# Patient Record
Sex: Female | Born: 1935 | Race: White | Hispanic: No | Smoking: Never smoker
Health system: Southern US, Community
[De-identification: ages and names within clinical notes are randomized; demographics above are authoritative.]

## PROBLEM LIST (undated history)

## (undated) DIAGNOSIS — C50919 Malignant neoplasm of unspecified site of unspecified female breast: Secondary | ICD-10-CM

## (undated) HISTORY — PX: HERNIA REPAIR: SHX51

## (undated) HISTORY — PX: BREAST LUMPECTOMY: SHX2

## (undated) HISTORY — PX: OTHER SURGICAL HISTORY: SHX169

## (undated) HISTORY — PX: CHOLECYSTECTOMY: SHX55

## (undated) HISTORY — DX: Malignant neoplasm of unspecified site of unspecified female breast: C50.919

---

## 2015-05-20 ENCOUNTER — Other Ambulatory Visit: Payer: Self-pay | Admitting: "Endocrinology

## 2015-05-20 DIAGNOSIS — E041 Nontoxic single thyroid nodule: Secondary | ICD-10-CM

## 2015-05-25 ENCOUNTER — Ambulatory Visit (HOSPITAL_COMMUNITY)
Admission: RE | Admit: 2015-05-25 | Discharge: 2015-05-25 | Disposition: A | Discharge status: Home / Self Care | Payer: Medicare FFS | Source: Ambulatory Visit | Attending: "Endocrinology | Admitting: "Endocrinology

## 2015-05-25 ENCOUNTER — Other Ambulatory Visit: Payer: Self-pay | Admitting: "Endocrinology

## 2015-05-25 DIAGNOSIS — E041 Nontoxic single thyroid nodule: Secondary | ICD-10-CM

## 2015-05-25 MED ORDER — LIDOCAINE HCL (PF) 2 % IJ SOLN
INTRAMUSCULAR | Status: AC
  Administered 2015-05-25: 10 mL
  Filled 2015-05-25: qty 10

## 2015-05-25 NOTE — Discharge Instructions (Signed)
Thyroid Biopsy °The thyroid gland is a butterfly-shaped gland situated in the front of the neck. It produces hormones which affect metabolism, growth and development, and body temperature. A thyroid biopsy is a procedure in which small samples of tissue or fluid are removed from the thyroid gland or mass and examined under a microscope. This test is done to determine the cause of thyroid problems, such as infection, cancer, or other thyroid problems. °There are 2 ways to obtain samples: °1. Fine needle biopsy. Samples are removed using a thin needle inserted through the skin and into the thyroid gland or mass. °2. Open biopsy. Samples are removed after a cut (incision) is made through the skin. °LET YOUR CAREGIVER KNOW ABOUT:  °· Allergies. °· Medications taken including herbs, eye drops, over-the-counter medications, and creams. °· Use of steroids (by mouth or creams). °· Previous problems with anesthetics or numbing medicine. °· Possibility of pregnancy, if this applies. °· History of blood clots (thrombophlebitis). °· History of bleeding or blood problems. °· Previous surgery. °· Other health problems. °RISKS AND COMPLICATIONS °· Bleeding from the site. The risk of bleeding is higher if you have a bleeding disorder or are taking any blood thinning medications (anticoagulants). °· Infection. °· Injury to structures near the thyroid gland. °BEFORE THE PROCEDURE  °This is a procedure that can be done as an outpatient. Confirm the time that you need to arrive for your procedure. Confirm whether there is a need to fast or withhold any medications. A blood sample may be done to determine your blood clotting time. Medicine may be given to help you relax (sedative). °PROCEDURE °Fine needle biopsy. °You will be awake during the procedure. You may be asked to lie on your back with your head tipped backward to extend your neck. Let your caregiver know if you cannot tolerate the positioning. An area on your neck will be  cleansed. A needle is inserted through the skin of your neck. You may feel a mild discomfort during this procedure. You may be asked to avoid coughing, talking, swallowing, or making sounds during some portions of the procedure. The needle is withdrawn once tissue or fluid samples have been removed. Pressure may be applied to the neck to reduce swelling and ensure that bleeding has stopped. The samples will be sent for examination.  °Open biopsy. °You will be given general anesthesia. You will be asleep during the procedure. An incision is made in your neck. A sample of thyroid tissue or the mass is removed. The tissue sample or mass will be sent for examination. The sample or mass may be examined during the biopsy. If the sample or mass contains cancer cells, some or all of the thyroid gland may be removed. The incision is closed with stitches. °AFTER THE PROCEDURE  °Your recovery will be assessed and monitored. If there are no problems, as an outpatient, you should be able to go home shortly after the procedure. °If you had a fine needle biopsy: °· You may have soreness at the biopsy site for 1 to 2 days. °If you had an open biopsy:  °· You may have soreness at the biopsy site for 3 to 4 days. °· You may have a hoarse voice or sore throat for 1 to 2 days. °Obtaining the Test Results °It is your responsibility to obtain your test results. Do not assume everything is normal if you have not heard from your caregiver or the medical facility. It is important for you to follow up   on all of your test results. °HOME CARE INSTRUCTIONS  °· Keeping your head raised on a pillow when you are lying down may ease biopsy site discomfort. °· Supporting the back of your head and neck with both hands as you sit up from a lying position may ease biopsy site discomfort. °· Only take over-the-counter or prescription medicines for pain, discomfort, or fever as directed by your caregiver. °· Throat lozenges or gargling with warm salt  water may help to soothe a sore throat. °SEEK IMMEDIATE MEDICAL CARE IF:  °· You have severe bleeding from the biopsy site. °· You have difficulty swallowing. °· You have a fever. °· You have increased pain, swelling, redness, or warmth at the biopsy site. °· You notice pus coming from the biopsy site. °· You have swollen glands (lymph nodes) in your neck. °Document Released: 06/19/2007 Document Revised: 12/17/2012 Document Reviewed: 11/14/2013 °Exit[employer]® Patient Information ©2015 [employer]. This information is not intended to replace advice given to you by your health care provider. Make sure you discuss any questions you have with your health care provider. ° °

## 2015-06-15 ENCOUNTER — Encounter: Payer: Self-pay | Admitting: "Endocrinology

## 2015-06-15 ENCOUNTER — Ambulatory Visit: Payer: Self-pay | Admitting: "Endocrinology

## 2015-06-15 ENCOUNTER — Ambulatory Visit (INDEPENDENT_AMBULATORY_CARE_PROVIDER_SITE_OTHER): Payer: Medicare FFS | Admitting: "Endocrinology

## 2015-06-15 VITALS — BP 124/80 | HR 77 | Ht 67.0 in | Wt 146.0 lb

## 2015-06-15 DIAGNOSIS — E042 Nontoxic multinodular goiter: Secondary | ICD-10-CM

## 2015-06-15 NOTE — Progress Notes (Signed)
Subjective:    Patient ID: Meghan Oconnell, female    DOB: [birth year],    Past Medical History  Diagnosis Date  . Breast cancer [facility])    Past Surgical History  Procedure Laterality Date  . Breast lumpectomy    . Cholecystectomy    . Other surgical history      Colon Surgery  . Hernia repair     Social History   Social History  . Marital Status: Widowed    Spouse Name: N/A  . Number of Children: N/A  . Years of Education: N/A   Social History Main Topics  . Smoking status: Never Smoker   . Smokeless tobacco: None  . Alcohol Use: No  . Drug Use: No  . Sexual Activity: Not Asked   Other Topics Concern  . None   Social History Narrative  . None   Outpatient Encounter Prescriptions as of 06/15/2015  Medication Sig  . citalopram (CELEXA) 40 MG tablet Take 40 mg by mouth daily.  . Linaclotide (LINZESS) 145 MCG CAPS capsule Take 145 mcg by mouth daily.   No facility-administered encounter medications on file as of 06/15/2015.   ALLERGIES: No Known Allergies VACCINATION STATUS:  There is no immunization history on file for this patient.  HPI 81 yr old female with medical hx as above. She is here to follow-up after FNA for MNG . Her FNA results are benign.  after initial thyroid incident adenoma on 09/22/2014 on CT scan after a blunt chest trauma , she underwent thyroid ultrasound on 2 occasions.  Her last repeat thyroid ultrasound on 04/30/2015 - showed  rt lobe 4.3 cms with 2 small nodules 22mm and 30mm, ( previously noted 45mm not seen on the repeat scan), left lobe 5.8 x 3.3x2.7 with 29mm new and 38 mm solid ovoid nodule (biopsied) . She denies choking, SOB. She however c/o dry cough which has been there for at least a year. she denies family hx of thyroid cancer, but one of her daughters has hx of MNG s/p thyroidectomy and on levothyroxine. Her TFTs in January were WNL.  Review of Systems   Constitutional: no weight gain/loss, no fatigue, no subjective  hyperthermia/hypothermia Eyes: no blurry vision, no xerophthalmia ENT: no sore throat, no nodules palpated in throat, no dysphagia/odynophagia, no hoarseness Cardiovascular: no CP/SOB/palpitations/leg swelling Respiratory: no cough/SOB Gastrointestinal: no N/V/D/C Musculoskeletal: no muscle/joint aches Skin: no rashes Neurological: no tremors/numbness/tingling/dizziness Psychiatric: no depression/anxiety   Objective:    BP 124/80 mmHg  Pulse 77  Ht 5\' 7"  (1.702 m)  Wt 146 lb (66.225 kg)  BMI 22.86 kg/m2  SpO2 95%  Wt Readings from Last 3 Encounters:  06/15/15 146 lb (66.225 kg)    Physical Exam Constitutional: overweight, in NAD Eyes: PERRLA, EOMI, no exophthalmos ENT: moist mucous membranes, has mild goiter, no cervical lymphadenopathy Cardiovascular: RRR, No MRG Respiratory: CTA B Gastrointestinal: abdomen soft, NT, ND, BS+ Musculoskeletal: no deformities, strength intact in all 4 Skin: moist, warm, no rashes Neurological: no tremor with outstretched hands, DTR normal in all 4    Assessment & Plan:   1. Multinodular goiter - Her THYROID, FINE NEEDLE ASPIRATION, LEFT (SPECIMEN 1 OF 1 COLLECTED 05/25/15): CONSISTENT WITH BENIGN FOLLICULAR NODULE (BETHESDA CATEGORY II). -She will not need intervention for this for now. -Given her TFTs WNL, she is euthyroid and hence no intervention is needed on thyroid function either. She will return in a year with thyroid function tests: - TSH - T4, free   I  advised patient to maintain close follow up with their PCP for primary care needs. Follow up plan: Return in about 1 year (around 06/14/2016) for follow up with pre-visit labs.  Glade Lloyd, MD Phone: [phone]  Fax: [phone]   06/15/2015, 3:31 PM

## 2016-06-15 ENCOUNTER — Ambulatory Visit (INDEPENDENT_AMBULATORY_CARE_PROVIDER_SITE_OTHER): Payer: Medicare FFS | Admitting: "Endocrinology

## 2016-06-15 ENCOUNTER — Encounter: Payer: Self-pay | Admitting: "Endocrinology

## 2016-06-15 VITALS — BP 125/77 | HR 73 | Ht 67.0 in | Wt 147.0 lb

## 2016-06-15 DIAGNOSIS — E042 Nontoxic multinodular goiter: Secondary | ICD-10-CM | POA: Diagnosis not present

## 2016-06-15 NOTE — Progress Notes (Signed)
Subjective:    Patient ID: Meghan Oconnell, female    DOB: 1931/01/22,    Past Medical History:  Diagnosis Date  . Breast cancer [facility])    Past Surgical History:  Procedure Laterality Date  . BREAST LUMPECTOMY    . CHOLECYSTECTOMY    . HERNIA REPAIR    . OTHER SURGICAL HISTORY     Colon Surgery   Social History   Social History  . Marital status: Widowed    Spouse name: N/A  . Number of children: N/A  . Years of education: N/A   Social History Main Topics  . Smoking status: Never Smoker  . Smokeless tobacco: Never Used  . Alcohol use No  . Drug use: No  . Sexual activity: Not Asked   Other Topics Concern  . None   Social History Narrative  . None   Outpatient Encounter Prescriptions as of 06/15/2016  Medication Sig  . ALPRAZolam (XANAX) 0.5 MG tablet Take 0.5 mg by mouth as needed for anxiety.  . citalopram (CELEXA) 40 MG tablet Take 40 mg by mouth daily.  . Linaclotide (LINZESS) 145 MCG CAPS capsule Take 145 mcg by mouth daily.   No facility-administered encounter medications on file as of 06/15/2016.    ALLERGIES: No Known Allergies VACCINATION STATUS:  There is no immunization history on file for this patient.  HPI   82 yr old female with medical hx as above. She is here to follow-up With repeat thyroid function test. She is status post FNA for MNG . Her FNA results are benign. She has no new complaints. She denies choking, SOB. She however c/o dry cough which has been there for at least a year. she denies family hx of thyroid cancer, but one of her daughters has hx of MNG s/p thyroidectomy and on levothyroxine. Her TFTs in January were WNL.  Review of Systems  Constitutional: no weight gain/loss, no fatigue, no subjective hyperthermia/hypothermia Eyes: no blurry vision, no xerophthalmia ENT: no sore throat, no nodules palpated in throat, no dysphagia/odynophagia, no hoarseness Cardiovascular: no CP/SOB/palpitations/leg swelling Respiratory:  no cough/SOB Gastrointestinal: no N/V/D/C Musculoskeletal: no muscle/joint aches Skin: no rashes Neurological: no tremors/numbness/tingling/dizziness Psychiatric: no depression/anxiety   Objective:    BP 125/77   Pulse 73   Ht 5\' 7"  (1.702 m)   Wt 147 lb (66.7 kg)   BMI 23.02 kg/m   Wt Readings from Last 3 Encounters:  06/15/16 147 lb (66.7 kg)  06/15/15 146 lb (66.2 kg)    Physical Exam Constitutional: overweight, in NAD Eyes: PERRLA, EOMI, no exophthalmos ENT: moist mucous membranes, has mild goiter- no change, no cervical lymphadenopathy Cardiovascular: RRR, No MRG Respiratory: CTA B Gastrointestinal: abdomen soft, NT, ND, BS+ Musculoskeletal: no deformities, strength intact in all 4 Skin: moist, warm, no rashes Neurological: no tremor with outstretched hands, DTR normal in all 4    Assessment & Plan:   1. Multinodular goiter - Her THYROID, FINE NEEDLE ASPIRATION, LEFT (SPECIMEN 1 OF 1 COLLECTED 05/25/15): CONSISTENT WITH BENIGN FOLLICULAR NODULE (BETHESDA CATEGORY II). -She will not need intervention for this for now. -Given her TFTs WNL, she is euthyroid and hence no intervention is needed on thyroid function either. She will return in a year  If needed .   I advised patient to maintain close follow up with their PCP for primary care needs. Follow up plan: Return if symptoms worsen or fail to improve in 1 yr.  Glade Lloyd, MD Phone: [phone]  Fax: [phone]  06/15/2016, 2:11 PM

## 2019-09-07 ENCOUNTER — Emergency Department (HOSPITAL_COMMUNITY): Payer: Medicare PPO

## 2019-09-07 ENCOUNTER — Encounter (HOSPITAL_COMMUNITY): Payer: Self-pay | Admitting: *Deleted

## 2019-09-07 ENCOUNTER — Emergency Department (HOSPITAL_COMMUNITY)
Admission: EM | Admit: 2019-09-07 | Discharge: 2019-09-09 | Disposition: A | Discharge status: Home / Self Care | Payer: Medicare PPO | Attending: Emergency Medicine | Admitting: Emergency Medicine

## 2019-09-07 ENCOUNTER — Other Ambulatory Visit: Payer: Self-pay

## 2019-09-07 DIAGNOSIS — Z79899 Other long term (current) drug therapy: Secondary | ICD-10-CM | POA: Diagnosis not present

## 2019-09-07 DIAGNOSIS — M25552 Pain in left hip: Secondary | ICD-10-CM

## 2019-09-07 DIAGNOSIS — M5416 Radiculopathy, lumbar region: Secondary | ICD-10-CM | POA: Insufficient documentation

## 2019-09-07 LAB — CBC WITH DIFFERENTIAL/PLATELET
Abs Immature Granulocytes: 0.05 10*3/uL (ref 0.00–0.07)
Basophils Absolute: 0.1 10*3/uL (ref 0.0–0.1)
Basophils Relative: 0 %
Eosinophils Absolute: 0.1 10*3/uL (ref 0.0–0.5)
Eosinophils Relative: 1 %
HCT: 46.1 % — ABNORMAL HIGH (ref 36.0–46.0)
Hemoglobin: 15 g/dL (ref 12.0–15.0)
Immature Granulocytes: 0 %
Lymphocytes Relative: 13 %
Lymphs Abs: 1.6 10*3/uL (ref 0.7–4.0)
MCH: 30.5 pg (ref 26.0–34.0)
MCHC: 32.5 g/dL (ref 30.0–36.0)
MCV: 93.9 fL (ref 80.0–100.0)
Monocytes Absolute: 1.1 10*3/uL — ABNORMAL HIGH (ref 0.1–1.0)
Monocytes Relative: 9 %
Neutro Abs: 9.1 10*3/uL — ABNORMAL HIGH (ref 1.7–7.7)
Neutrophils Relative %: 77 %
Platelets: 253 10*3/uL (ref 150–400)
RBC: 4.91 MIL/uL (ref 3.87–5.11)
RDW: 12.5 % (ref 11.5–15.5)
WBC: 11.9 10*3/uL — ABNORMAL HIGH (ref 4.0–10.5)
nRBC: 0 % (ref 0.0–0.2)

## 2019-09-07 LAB — SEDIMENTATION RATE: Sed Rate: 2 mm/hr (ref 0–22)

## 2019-09-07 LAB — BASIC METABOLIC PANEL
Anion gap: 11 (ref 5–15)
BUN: 36 mg/dL — ABNORMAL HIGH (ref 8–23)
CO2: 26 mmol/L (ref 22–32)
Calcium: 8.8 mg/dL — ABNORMAL LOW (ref 8.9–10.3)
Chloride: 102 mmol/L (ref 98–111)
Creatinine, Ser: 0.95 mg/dL (ref 0.44–1.00)
GFR calc Af Amer: >60 mL/min (ref >60)
GFR calc non Af Amer: 54 mL/min — ABNORMAL LOW (ref >60)
Glucose, Bld: 100 mg/dL — ABNORMAL HIGH (ref 70–99)
Potassium: 4.1 mmol/L (ref 3.5–5.1)
Sodium: 139 mmol/L (ref 135–145)

## 2019-09-07 LAB — C-REACTIVE PROTEIN: CRP: 0.6 mg/dL (ref <1.0)

## 2019-09-07 MED ORDER — CITALOPRAM HYDROBROMIDE 20 MG PO TABS
40.0000 mg | ORAL_TABLET | Freq: Every day | ORAL | Status: DC
  Administered 2019-09-07 – 2019-09-08 (×2): 40 mg via ORAL
  Filled 2019-09-07 (×2): qty 2
  Filled 2019-09-07 (×3): qty 1

## 2019-09-07 MED ORDER — FENTANYL CITRATE (PF) 100 MCG/2ML IJ SOLN
50.0000 ug | Freq: Once | INTRAMUSCULAR | Status: AC
  Administered 2019-09-07: 50 ug via INTRAVENOUS
  Filled 2019-09-07: qty 2

## 2019-09-07 MED ORDER — METHOCARBAMOL 500 MG PO TABS
500.0000 mg | ORAL_TABLET | Freq: Once | ORAL | Status: AC
  Administered 2019-09-07: 500 mg via ORAL
  Filled 2019-09-07: qty 1

## 2019-09-07 MED ORDER — LINACLOTIDE 145 MCG PO CAPS
145.0000 ug | ORAL_CAPSULE | Freq: Every day | ORAL | Status: DC
  Administered 2019-09-07 – 2019-09-08 (×2): 145 ug via ORAL
  Filled 2019-09-07 (×7): qty 1

## 2019-09-07 MED ORDER — KETOROLAC TROMETHAMINE 30 MG/ML IJ SOLN
15.0000 mg | Freq: Once | INTRAMUSCULAR | Status: AC
  Administered 2019-09-07: 15 mg via INTRAVENOUS
  Filled 2019-09-07: qty 1

## 2019-09-07 MED ORDER — HYDROCODONE-ACETAMINOPHEN 5-325 MG PO TABS
1.0000 | ORAL_TABLET | Freq: Once | ORAL | Status: AC
  Administered 2019-09-07: 1 via ORAL
  Filled 2019-09-07: qty 1

## 2019-09-07 MED ORDER — OXYCODONE-ACETAMINOPHEN 5-325 MG PO TABS
1.0000 | ORAL_TABLET | Freq: Once | ORAL | Status: AC
  Administered 2019-09-07: 1 via ORAL
  Filled 2019-09-07: qty 1

## 2019-09-07 MED ORDER — NAPROXEN 250 MG PO TABS
375.0000 mg | ORAL_TABLET | Freq: Two times a day (BID) | ORAL | Status: DC
  Administered 2019-09-08 – 2019-09-09 (×3): 375 mg via ORAL
  Filled 2019-09-07 (×3): qty 2

## 2019-09-07 MED ORDER — ALPRAZOLAM 0.5 MG PO TABS: 0.5000 mg | ORAL_TABLET | ORAL | Status: DC | PRN

## 2019-09-07 MED ORDER — OXYCODONE HCL 5 MG PO TABS
5.0000 mg | ORAL_TABLET | Freq: Three times a day (TID) | ORAL | Status: DC | PRN
  Administered 2019-09-08 (×2): 5 mg via ORAL
  Filled 2019-09-07 (×2): qty 1

## 2019-09-07 MED ORDER — ACETAMINOPHEN 325 MG PO TABS
650.0000 mg | ORAL_TABLET | Freq: Four times a day (QID) | ORAL | Status: DC
  Administered 2019-09-07 – 2019-09-09 (×6): 650 mg via ORAL
  Filled 2019-09-07 (×6): qty 2

## 2019-09-07 MED ORDER — METHYLPREDNISOLONE SODIUM SUCC 125 MG IJ SOLR
125.0000 mg | Freq: Once | INTRAMUSCULAR | Status: AC
  Administered 2019-09-07: 19:00:00 125 mg via INTRAVENOUS
  Filled 2019-09-07: qty 2

## 2019-09-07 NOTE — ED Provider Notes (Addendum)
[facility] EMERGENCY DEPARTMENT Provider Note   CSN: [id number] Arrival date & time: 09/07/19  1416     History Chief Complaint  Patient presents with  . Hip Pain    left    Meghan Oconnell is a 86 y.o. female.  HPI    86 year old female comes in w/ chief complaint of hip pain.  She has no significant medical history and reports that she has been having left-sided hip pain for the last 1 week.  She denies any trauma or strain to the area.  Pain is gradually gotten worse and at the moment is rated at 8-10/10.  Patient has gone to an urgent care and ER and had x-rays which are negative.  She is taking something for pain but does not recall the name.  She has been staying with her daughter because of the pain.  Patient denies any nausea, vomiting, fevers, chills.  No history of surgery on that side or similar pain.  Past Medical History:  Diagnosis Date  . Breast cancer [facility])     Patient Active Problem List   Diagnosis Date Noted  . Multinodular goiter 06/15/2015    Past Surgical History:  Procedure Laterality Date  . BREAST LUMPECTOMY    . CHOLECYSTECTOMY    . HERNIA REPAIR    . OTHER SURGICAL HISTORY     Colon Surgery     OB History   No obstetric history on file.     Family History  Problem Relation Age of Onset  . Cancer Sister     Social History   Tobacco Use  . Smoking status: Never Smoker  . Smokeless tobacco: Never Used  Substance Use Topics  . Alcohol use: No    Alcohol/week: 0.0 standard drinks  . Drug use: No    Home Medications Prior to Admission medications   Medication Sig Start Date End Date Taking? Authorizing Provider  ALPRAZolam Duanne Moron) 0.5 MG tablet Take 0.5 mg by mouth as needed for anxiety.    [provider]  citalopram (CELEXA) 40 MG tablet Take 40 mg by mouth daily.    [provider]  Linaclotide Rolan Lipa) 145 MCG CAPS capsule Take 145 mcg by mouth daily.    [provider]    Allergies      Patient has no known allergies.  Review of Systems   Review of Systems  Constitutional: Positive for activity change.  Gastrointestinal: Negative for nausea and vomiting.  Musculoskeletal: Positive for arthralgias.  Allergic/Immunologic: Negative for immunocompromised state.  Hematological: Does not bruise/bleed easily.  All other systems reviewed and are negative.   Physical Exam Updated Vital Signs BP 114/63 (BP Location: Left Arm)   Pulse 63   Temp 98.3 F (36.8 C) (Oral)   Resp 14   Ht 5' 4.5" (1.638 m)   Wt 54.4 kg   SpO2 97%   BMI 20.28 kg/m   Physical Exam Vitals and nursing note reviewed.  Constitutional:      Appearance: She is well-developed.  HENT:     Head: Normocephalic and atraumatic.  Cardiovascular:     Rate and Rhythm: Normal rate.  Pulmonary:     Effort: Pulmonary effort is normal.  Abdominal:     General: Bowel sounds are normal.  Musculoskeletal:        General: Tenderness present. No swelling or deformity.     Cervical back: Normal range of motion and neck supple.     Comments: Patient has tenderness over the left hip  posteriorly.  She is able to participate in L active leg raise and able to flex her L hip actively.  She has tenderness with external rotation of the hip with no significant discomfort with internal rotation of the hip. Tenderness to palpation over the lower back  Skin:    General: Skin is warm and dry.  Neurological:     Mental Status: She is alert and oriented to person, place, and time.     ED Results / Procedures / Treatments   Labs (all labs ordered are listed, but only abnormal results are displayed) Labs Reviewed  BASIC METABOLIC PANEL - Abnormal; Notable for the following components:      Result Value   Glucose, Bld 100 (*)    BUN 36 (*)    Calcium 8.8 (*)    GFR calc non Af Amer 54 (*)    All other components within normal limits  CBC WITH DIFFERENTIAL/PLATELET - Abnormal; Notable for the following components:    WBC 11.9 (*)    HCT 46.1 (*)    Neutro Abs 9.1 (*)    Monocytes Absolute 1.1 (*)    All other components within normal limits  SEDIMENTATION RATE  C-REACTIVE PROTEIN    EKG None  Radiology CT PELVIS WO CONTRAST  Result Date: 09/07/2019 CLINICAL DATA:  Left hip pain. No known injury. EXAM: CT PELVIS WITHOUT CONTRAST TECHNIQUE: Multidetector CT imaging of the pelvis was performed following the standard protocol without intravenous contrast. COMPARISON:  None. FINDINGS: Urinary Tract:  No abnormality visualized. Bowel:  No focal bowel thickening or inflammation within the pelvis. 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 Aortoiliac atherosclerosis. No pelvic lymphadenopathy. No inguinal lymphadenopathy. Reproductive:  No mass or other significant abnormality Other:  No free fluid within the pelvis. Musculoskeletal: No acute fracture. No dislocation. 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 SI joints and pubic symphysis are intact without diastasis. Bilateral hip joint spaces are relatively preserved with mild degenerative change. No evidence of femoral head AVN by CT. No lytic or sclerotic bone lesion. No appreciable hip joint effusion. No soft tissue fluid collection or hematoma. No bursal fluid collections are appreciated about either hip. IMPRESSION: 1. No acute fracture or dislocation of the bilateral hips or pelvis. No significant hip joint effusion. 2. 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 Vascular surgery consultation is recommended. 3. Aortic atherosclerosis. Aortic Atherosclerosis (ICD10-I70.0). Electronically Signed   By: Davina Poke D.O.   On: 09/07/2019 16:39    Procedures Procedures (including critical care time)  Medications Ordered in  ED Medications  methocarbamol (ROBAXIN) tablet 500 mg (has no administration in time range)  methylPREDNISolone sodium succinate (SOLU-MEDROL) 125 mg/2 mL injection 125 mg (has no administration in time range)  ketorolac (TORADOL) 30 MG/ML injection 15 mg (has no administration in time range)  fentaNYL (SUBLIMAZE) injection 50 mcg (has no administration in time range)  HYDROcodone-acetaminophen (NORCO/VICODIN) 5-325 MG per tablet 1 tablet (1 tablet Oral Given 09/07/19 1559)  oxyCODONE-acetaminophen (PERCOCET/ROXICET) 5-325 MG per tablet 1 tablet (1 tablet Oral Given 09/07/19 1743)    ED Course  I have reviewed the triage vital signs and the nursing notes.  Pertinent labs & imaging results that were available during my care of the patient were reviewed by me and considered in my medical decision making (see  chart for details).  Clinical Course as of Sep 07 1851  Sat Sep 07, 2019  [id number] CT shows incidental finding of saccular aneurysm.  I spoke with Dr. Scot Dock, and he will coordinate a follow-up with vascular surgery for formal studies.  Pretty sure that the saccular aneurysm is not the cause for her pain.  CT PELVIS WO CONTRAST [AN]  [id number] Results of the ER work-up discussed with the patient.  Patient is in significant discomfort and not comfortable going home.  I suspect that she is having sciatica, radiculopathy versus herniated disc.  MRI of the spine has been ordered.  She will stay in the ED until MRI is completed.  CT PELVIS WO CONTRAST [AN]    Clinical Course User Index [AN] Varney Biles, MD   MDM Rules/Calculators/A&P                      86 year old female comes in a chief complaint of left-sided hip pain.  Pain started unprovoked.  Differential diagnosis would include occult fracture, septic arthritis, tendinitis, bursitis.  No signs of infection on gross exam.  Doubt septic arthritis as patient is able to actively raise the left leg, flex the left hip and able to participate in  internal and external rotation.  She has had 2 - x-rays.  We will proceed with CT scan to look for any occult fracture.  Basic labs have been ordered to be sure that there is no underlying infectious process.  The other possibilities that she is having radiculopathy, as she has lower back pain and numbness in her lower extremity as well.  Final Clinical Impression(s) / ED Diagnoses Final diagnoses:  Lumbar radiculopathy  Left hip pain    Rx / DC Orders ED Discharge Orders    None       Varney Biles, MD 09/07/19 Willard, Crystalynn Mcinerney, MD 09/07/19 [phone]

## 2019-09-07 NOTE — ED Notes (Signed)
Patient ambulated in hall approx 15 steps. Patient's gait unsteady, not tolerating well. Patient hopping and not bearing much weight on left leg. Patient's gait evaluated by EDP as well.

## 2019-09-07 NOTE — ED Triage Notes (Signed)
Patent arrived to this ER via [facility] due to left hip pain since christmas.  Patient denies any falls.  Patient has an appointment with PCP for this hip pain.

## 2019-09-08 MED ORDER — MORPHINE SULFATE (PF) 4 MG/ML IV SOLN
4.0000 mg | Freq: Once | INTRAVENOUS | Status: AC
  Administered 2019-09-08: 4 mg via INTRAMUSCULAR
  Filled 2019-09-08: qty 1

## 2019-09-08 NOTE — ED Notes (Signed)
Peanut butter crackers and sprite given to pt

## 2019-09-08 NOTE — ED Notes (Addendum)
Pt sleeping on side. Blood pressure noted to be 80s/40s. Pt repositioned and rechecked pressure, 103/62.

## 2019-09-08 NOTE — Plan of Care (Signed)
  Problem: Acute Rehab PT Goals(only PT should resolve) Goal: Pt Will Go Supine/Side To Sit Outcome: Progressing Flowsheets (Taken 09/08/2019 1020) Pt will go Supine/Side to Sit: Independently Goal: Patient Will Transfer Sit To/From Stand Outcome: Progressing Flowsheets (Taken 09/08/2019 1020) Patient will transfer sit to/from stand:  with supervision  with min guard assist Goal: Pt Will Transfer Bed To Chair/Chair To Bed Outcome: Progressing Flowsheets (Taken 09/08/2019 1020) Pt will Transfer Bed to Chair/Chair to Bed:  with supervision  min guard assist Goal: Pt Will Ambulate Outcome: Progressing Flowsheets (Taken 09/08/2019 1020) Pt will Ambulate:  50 feet  with min guard assist  with minimal assist  with rolling walker   10:20 AM, 09/08/19 Lonell Grandchild, MPT Physical Therapist with [facility] 336 [phone] office [phone] mobile phone

## 2019-09-08 NOTE — ED Notes (Signed)
Pt reports the upper abd pain is gone.

## 2019-09-08 NOTE — Evaluation (Signed)
Physical Therapy Evaluation Patient Details Name: Meghan Oconnell MRN: [id number] DOB: [birth year] Today's Date: 09/08/2019   History of Present Illness  Meghan Oconnell is a 86 year old female comes in w/ chief complaint of hip pain.  She has no significant medical history and reports that she has been having left-sided hip pain for the last 1 week.  She denies any trauma or strain to the area.  Pain is gradually gotten worse and at the moment is rated at 8-10/10.  Patient has gone to an urgent care and ER and had x-rays which are negative.  She is taking something for pain but does not recall the name.  She has been staying with her daughter because of the pain.    Clinical Impression  Patient limited for taking steps due to c/o increased pain with weightbearing on LLE, tolerated toe touch, but unable to put left heel down, very unsteady on feet and fatigues easily with near loss of balance when returning back to bedside.  Patient put back to bed and instructed in piriformis stretching to left hip with fair/good return demonstrated, tolerated stretching to left hip, but states no improvement in pain level.  Patient will benefit from continued physical therapy in hospital and recommended venue below to increase strength, balance, endurance for safe ADLs and gait.  Wheelchair Recommendation:  Patient suffers from severe left hip pain which impairs her ability to perform daily activities like walking, prolonged standing and completing ADLs in the home.  A walker alone will not resolve the issues with performing activities of daily living. A wheelchair will allow patient to safely perform daily activities.  The patient can self propel in the home and has a caregiver who can provide assistance.       Follow Up Recommendations Home health PT;Supervision - Intermittent;Supervision for mobility/OOB    Equipment Recommendations  Wheelchair (measurements PT);Wheelchair cushion (measurements PT)     Recommendations for Other Services       Precautions / Restrictions Precautions Precautions: Fall Restrictions Weight Bearing Restrictions: No      Mobility  Bed Mobility Overal bed mobility: Modified Independent             General bed mobility comments: increased time  Transfers Overall transfer level: Needs assistance Equipment used: Rolling walker (2 wheeled) Transfers: Sit to/from [facility] Sit to Stand: Min guard;Min assist Stand pivot transfers: Min assist       General transfer comment: poor tolerance for weightbearing on LLE due to increased pain  Ambulation/Gait Ambulation/Gait assistance: Min assist Gait Distance (Feet): 15 Feet Assistive device: Rolling walker (2 wheeled) Gait Pattern/deviations: Decreased step length - right;Decreased step length - left;Decreased stance time - left;Decreased stride length;Antalgic Gait velocity: decreased   General Gait Details: limited to taking steps in room due to poor weighbearing on LLE due to increased pain with movement, frequent near loss of balance  Stairs            Wheelchair Mobility    Modified Rankin (Stroke Patients Only)       Balance Overall balance assessment: Needs assistance Sitting-balance support: No upper extremity supported;Feet supported Sitting balance-Leahy Scale: Fair Sitting balance - Comments: seated at bedside   Standing balance support: Bilateral upper extremity supported;During functional activity Standing balance-Leahy Scale: Poor Standing balance comment: fair/poor using RW due to limited weightbearing on LLE  Pertinent Vitals/Pain Pain Assessment: Faces Faces Pain Scale: Hurts whole lot Pain Location: left hip with radiation down to top of left foot Pain Descriptors / Indicators: Grimacing;Guarding;Sharp;Shooting Pain Intervention(s): Limited activity within patient's tolerance;Monitored during  session;Repositioned    Home Living Family/patient expects to be discharged to:: Private residence Living Arrangements: Children Available Help at Discharge: Family;Available 24 hours/day Type of Home: House Home Access: Stairs to enter Entrance Stairs-Rails: None Entrance Stairs-Number of Steps: 1 step into garage Home Layout: One level Home Equipment: Environmental consultant - 2 wheels;Cane - single point      Prior Function Level of Independence: Independent         Comments: Hydrographic surveyor, drives     Journalist, newspaper        Extremity/Trunk Assessment   Upper Extremity Assessment Upper Extremity Assessment: Overall WFL for tasks assessed    Lower Extremity Assessment Lower Extremity Assessment: Generalized weakness;RLE deficits/detail;LLE deficits/detail RLE Deficits / Details: grossly 5/5 RLE Sensation: WNL RLE Coordination: WNL LLE Deficits / Details: grossly 3+/5 LLE: Unable to fully assess due to pain LLE Sensation: WNL LLE Coordination: WNL    Cervical / Trunk Assessment Cervical / Trunk Assessment: Normal  Communication   Communication: No difficulties  Cognition Arousal/Alertness: Awake/alert Behavior During Therapy: WFL for tasks assessed/performed;Anxious Overall Cognitive Status: Within Functional Limits for tasks assessed                                        General Comments      Exercises     Assessment/Plan    PT Assessment Patient needs continued PT services  PT Problem List Decreased strength;Decreased activity tolerance;Decreased balance;Decreased mobility;Pain       PT Treatment Interventions DME instruction;Gait training;Stair training;Functional mobility training;Therapeutic activities;Therapeutic exercise;Balance training;Patient/family education;Wheelchair mobility training    PT Goals (Current goals can be found in the Care Plan section)  Acute Rehab PT Goals Patient Stated Goal: return home with family to assist PT  Goal Formulation: With patient Time For Goal Achievement: 09/15/19 Potential to Achieve Goals: Good    Frequency Min 2X/week   Barriers to discharge        Co-evaluation               AM-PAC PT "6 Clicks" Mobility  Outcome Measure Help needed turning from your back to your side while in a flat bed without using bedrails?: None Help needed moving from lying on your back to sitting on the side of a flat bed without using bedrails?: A Little Help needed moving to and from a bed to a chair (including a wheelchair)?: A Little Help needed standing up from a chair using your arms (e.g., wheelchair or bedside chair)?: A Little Help needed to walk in hospital room?: A Lot Help needed climbing 3-5 steps with a railing? : Total 6 Click Score: 16    End of Session   Activity Tolerance: Patient tolerated treatment well;Patient limited by fatigue;Patient limited by pain Patient left: in bed;with call bell/phone within reach Nurse Communication: Mobility status PT Visit Diagnosis: Unsteadiness on feet (R26.81);Other abnormalities of gait and mobility (R26.89);Muscle weakness (generalized) (M62.81);Pain Pain - Right/Left: Left Pain - part of body: Hip    Time: 0926-0952 PT Time Calculation (min) (ACUTE ONLY): 26 min   Charges:   PT Evaluation $PT Eval Moderate Complexity: 1 Mod PT Treatments $Therapeutic Activity: 23-37 mins  10:17 AM, 09/08/19 Lonell Grandchild, MPT Physical Therapist with [facility] [phone] office [phone] mobile phone

## 2019-09-08 NOTE — ED Notes (Signed)
Pt complains of upper abd pain

## 2019-09-09 ENCOUNTER — Emergency Department (HOSPITAL_COMMUNITY): Payer: Medicare PPO

## 2019-09-09 MED ORDER — FENTANYL CITRATE (PF) 100 MCG/2ML IJ SOLN
50.0000 ug | Freq: Once | INTRAMUSCULAR | Status: AC
  Administered 2019-09-09: 50 ug via INTRAVENOUS
  Filled 2019-09-09: qty 2

## 2019-09-09 MED ORDER — OXYCODONE HCL 5 MG PO TABS
5.0000 mg | ORAL_TABLET | Freq: Four times a day (QID) | ORAL | Status: DC | PRN
  Administered 2019-09-09: 5 mg via ORAL
  Filled 2019-09-09: qty 1

## 2019-09-09 MED ORDER — PREDNISONE 20 MG PO TABS: 20.0000 mg | ORAL_TABLET | Freq: Every day | ORAL | 0 refills | Status: DC

## 2019-09-09 MED ORDER — TRAMADOL HCL 50 MG PO TABS: 50.0000 mg | ORAL_TABLET | Freq: Three times a day (TID) | ORAL | 0 refills | Status: DC | PRN

## 2019-09-09 NOTE — ED Notes (Signed)
IV to right AC removed for pt to discharge home. Tolerated well.

## 2019-09-09 NOTE — TOC Transition Note (Signed)
Transition of Care [facility]) - CM/SW Discharge Note   Patient Details  Name: Christopher Giang MRN: [id number] Date of Birth: 1931-08-11  Transition of Care [facility] CM/SW Contact:  Sherald Barge, RN Phone Number: 09/09/2019, 9:30 AM   Clinical Narrative:     [facility] consult for [facility] and DME. PT recommends HH and WC, Pt declines both at this time. Says she can do PT by herself at home. Pt lies alone, has 3 daughters who provide strong support. Pt uses a RW with ambulation. Pt most concerned about pain control at this time.  Pt encouraged to let RN know if she changes her mind prior to DC and to contact her PCP after DC.      Patient Goals and CMS Choice Patient states their goals for this hospitalization and ongoing recovery are:: do PT on her own, manage pain      Discharge Plan and Services                DME Arranged: Patient refused services         HH Arranged: Patient Refused [facility]

## 2019-09-27 ENCOUNTER — Other Ambulatory Visit: Payer: Self-pay

## 2019-09-27 DIAGNOSIS — I998 Other disorder of circulatory system: Secondary | ICD-10-CM

## 2019-10-10 ENCOUNTER — Ambulatory Visit
Admission: RE | Admit: 2019-10-10 | Discharge: 2019-10-10 | Disposition: A | Discharge status: Home / Self Care | Payer: Medicare PPO | Source: Ambulatory Visit | Attending: Vascular Surgery | Admitting: Vascular Surgery

## 2019-10-10 ENCOUNTER — Other Ambulatory Visit: Payer: Self-pay

## 2019-10-10 DIAGNOSIS — I998 Other disorder of circulatory system: Secondary | ICD-10-CM

## 2019-10-10 IMAGING — CT CT CTA ABD/PEL W/CM AND/OR W/O CM
1 series · 9 of 32 positions shown, 11 images · IV contrast (APPLIED)
Comparison: 09/07/2019

CLINICAL DATA: Abdominal aortic aneurysm by pelvis CT 09/07/2019

EXAM:
CT ANGIOGRAPHY ABDOMEN AND PELVIS WITH CONTRAST AND WITHOUT CONTRAST
TECHNIQUE: Multidetector CT imaging of the abdomen and pelvis was performed
using the standard protocol during bolus administration of
intravenous contrast. Multiplanar reconstructed images and MIPs were
obtained and reviewed to evaluate the vascular anatomy.
CONTRAST:  75mL [id number] IOPAMIDOL ([id number]) INJECTION 76%

[Series 12: pre stent sag · sagittal · non-contrast · 0.57mm/px · 9 of 168 slices shown, 11 images]
[im 6/168  lung]
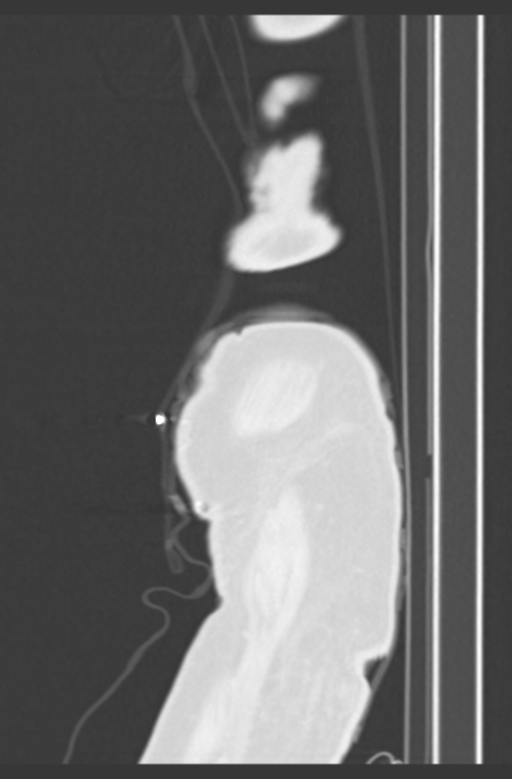
[im 11/168  lung]
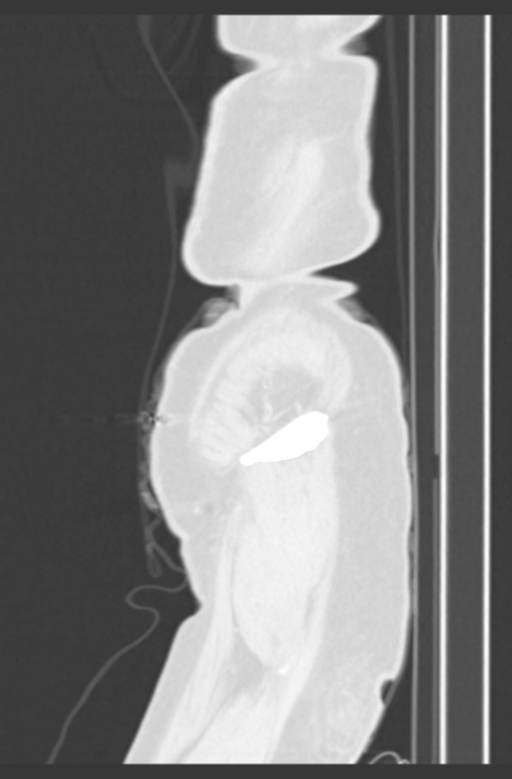
[im 17/168  soft-tissue]
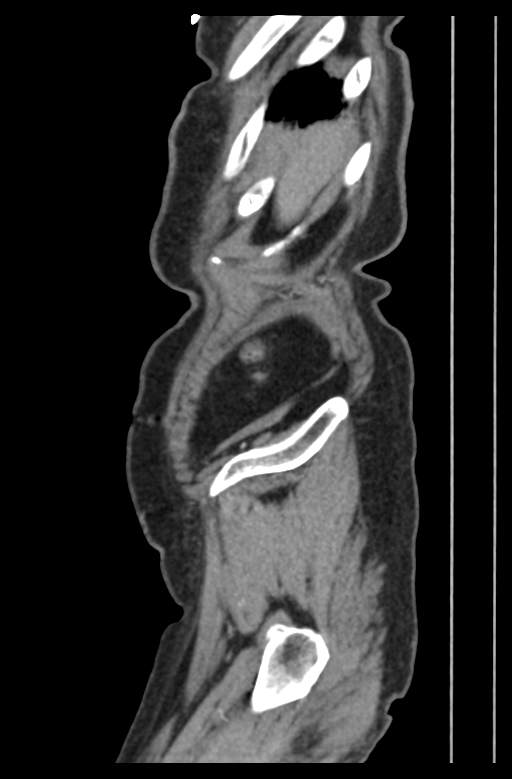
[im 17/168  lung]
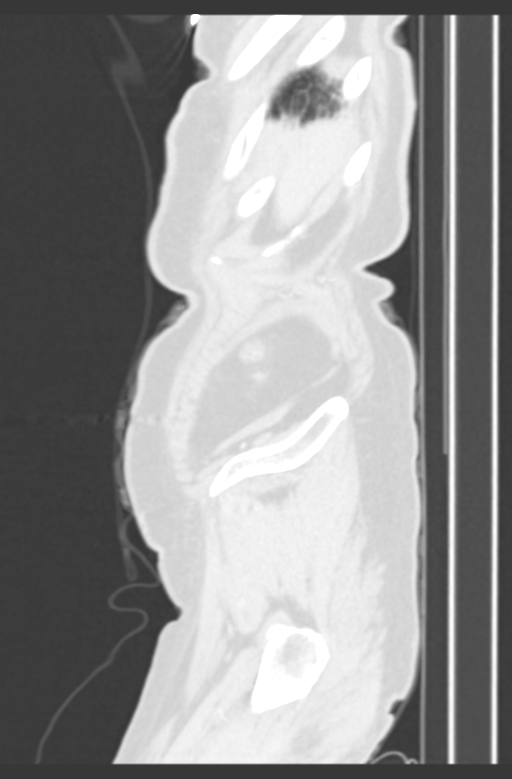
[im 17/168  bone]
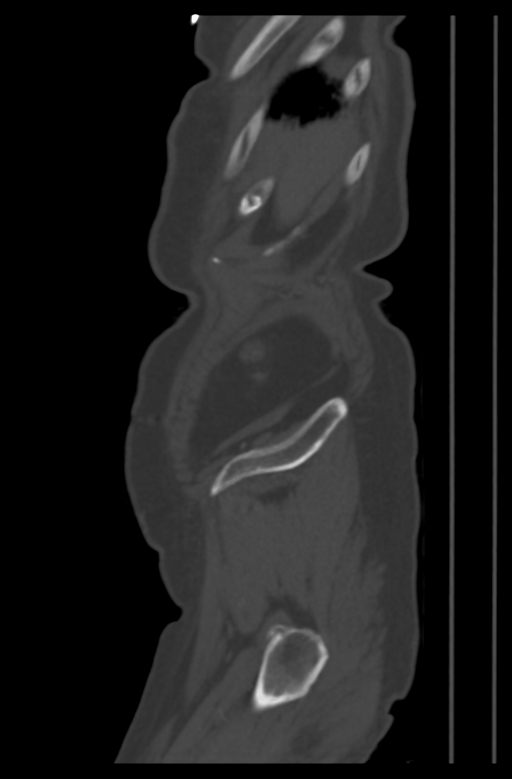
[im 22/168  lung]
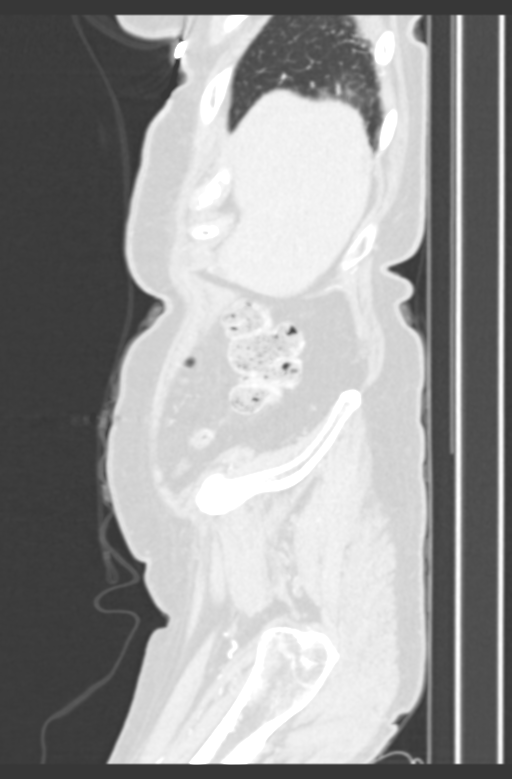
[im 44/168  soft-tissue]
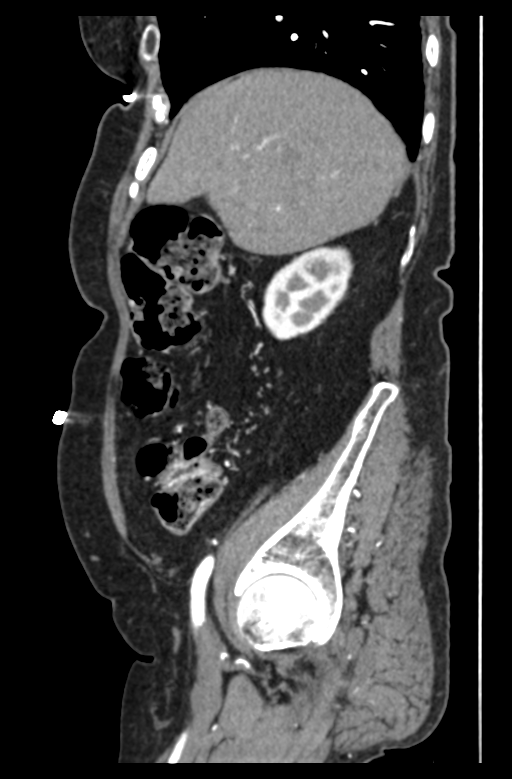
[im 71/168  soft-tissue]
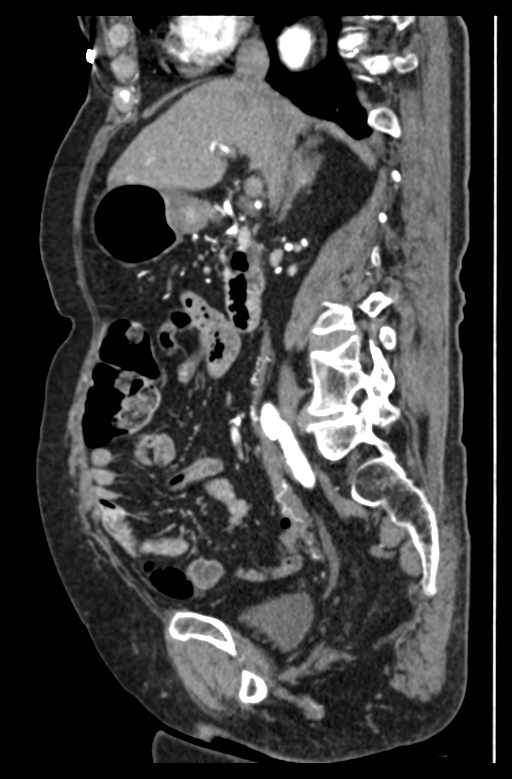
[im 97/168  soft-tissue]
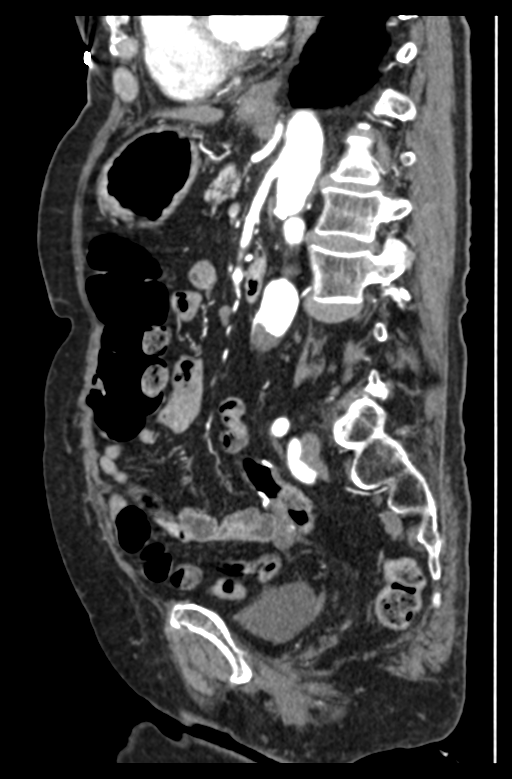
[im 124/168  soft-tissue]
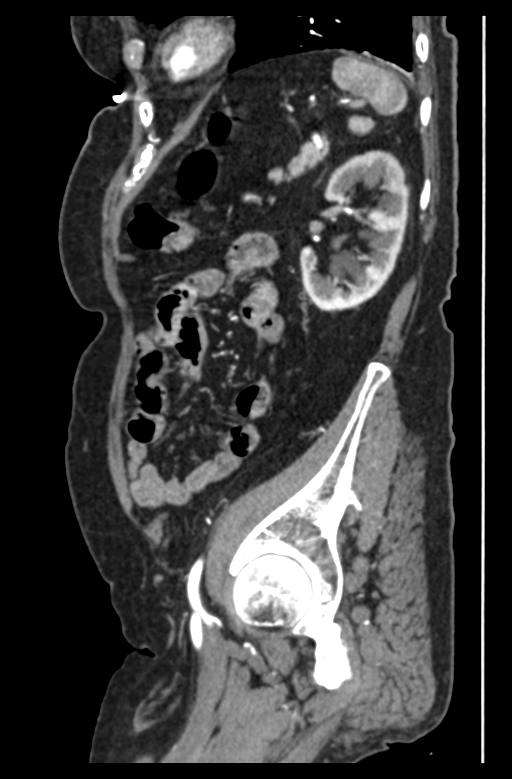
[im 151/168  soft-tissue]
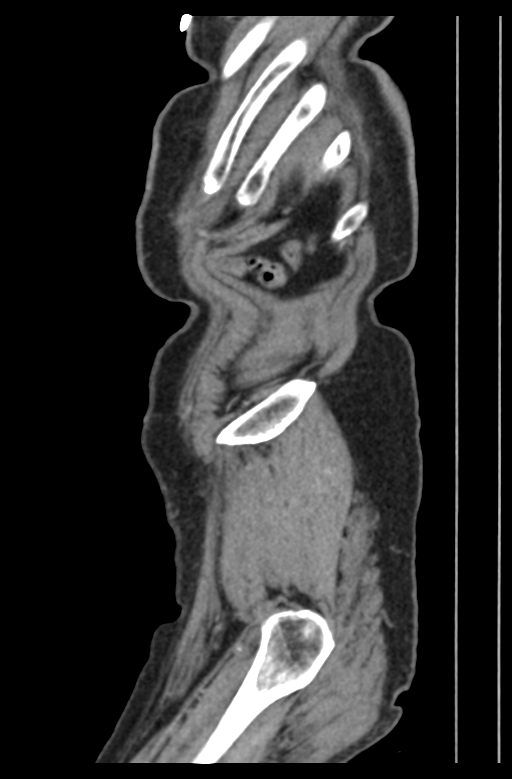

[9 of 32 positions shown; findings below may reference images not displayed]

FINDINGS: VASCULAR

Aorta: Atherosclerosis and tortuosity noted of the distal descending
thoracic aorta at the diaphragmatic hiatus, maximal diameter 2.5 cm.

Abdominal aorta demonstrates hypodense wall irregularity and
scattered calcifications from atherosclerosis with slight
tortuosity. Infrarenal distal abdominal aorta demonstrates a
thrombosed left anterolateral focal saccular aneurysm just below the
IMA origin measuring 1.7 x 1.3 cm, image 94 series 4. Negative for
aortic dissection, intramural hematoma, occlusive process, rupture
or retroperitoneal hemorrhage.

Celiac: Atherosclerotic small celiac origin but remains patent
supplying the splenic, left gastric, and left hepatic vasculature.
Partially calcified distal splenic artery saccular aneurysm measures
11 x 11 x 16 mm, image 36 series 4.

Separate origin noted of the common hepatic vasculature supplying
the right hepatic artery and gastroduodenal artery also small in
caliber but patent.

SMA: Atherosclerotic origin but remains patent. No significant
stenosis.

Renals: Atherosclerotic origins with wall irregularity and minor
narrowing but no significant stenosis appreciated. No accessory
renal vasculature identified.

IMA: Small in caliber but remains patent off the distal aorta
anteriorly.

Inflow: Iliac tortuosity and atherosclerosis noted without inflow
disease or occlusion. Common, internal and external iliac arteries
remain patent.

Proximal Outflow: Bilateral common femoral and visualized portions
of the superficial and profunda femoral arteries are patent without
evidence of aneurysm, dissection, vasculitis or significant
stenosis.

Veins: No Jumper process within the imaged abdomen.

Review of the MIP images confirms the above findings.

NON-VASCULAR

Lower chest: Subtle peripheral right lower lobe micro nodular
tree-in-bud type pattern noted which can be seen with
infectious/inflammatory process or bronchiolitis. Minor associated
basilar atelectasis/scarring. No pleural or pericardial effusion.
Normal heart size.

Hepatobiliary: There are a few scattered subcentimeter hypodensities
in the right lobe, too small to definitively characterize but
suspect small hepatic cysts. Limited evaluation by arterial phase
imaging. Otherwise no significant hepatic abnormality. No biliary
dilatation. Remote cholecystectomy. Common bile duct nondilated.

Pancreas: Unremarkable. No pancreatic ductal dilatation or
surrounding inflammatory changes.

Spleen: Spleen normal in size with arterial phase enhancement.
Accessory splenules noted.

Adrenals/Urinary Tract: Normal adrenal glands. No renal obstruction
or hydronephrosis. Small parapelvic cysts noted in the left kidney.
No focal cortical abnormality or mass. Ureters are symmetric and
decompressed. Bladder unremarkable.

Stomach/Bowel: Incidental duodenal diverticulum noted with an
air-fluid level, image 70 series 4. Negative for bowel obstruction,
significant dilatation, ileus, or free air. Normal appearing
appendix along the left pelvic sidewall. Remote sigmoid resection
noted. Scattered diverticulosis without acute inflammatory process.

No free fluid, fluid collection, hemorrhage, hematoma, abscess, or
ascites.

Lymphatic: No bulky adenopathy.

Reproductive: Atrophic uterus.  No adnexal abnormality.

Other: No abdominal wall hernia or abnormality. No abdominopelvic
ascites.

Musculoskeletal: Degenerative changes of the spine with associated
scoliosis. No acute osseous finding.
IMPRESSION: VASCULAR

Distal abdominal aorta focal saccular aneurysm which appears
thrombosed measuring 1.7 x 1.3 cm.

16 mm saccular aneurysm of the peripheral splenic artery with
partial wall calcification remains patent. No other complicating
feature including evidence of rupture, hemorrhage, thrombus
formation or peripheral emboli. No splenic infarcts.

Mesenteric and renal vasculature appear patent.

No aortoiliac occlusive process or other acute vascular finding.

NON-VASCULAR

Nonspecific peripheral right lower lobe tree-in-bud opacities
suggesting infectious/inflammatory process, mild.

Remote cholecystectomy and hysterectomy

Diverticulosis without acute inflammatory process

## 2019-10-10 MED ORDER — IOPAMIDOL (ISOVUE-370) INJECTION 76%
75.0000 mL | Freq: Once | INTRAVENOUS | Status: AC | PRN
  Administered 2019-10-10: 75 mL via INTRAVENOUS

## 2019-10-16 ENCOUNTER — Other Ambulatory Visit: Payer: Self-pay

## 2019-10-16 ENCOUNTER — Encounter: Payer: Self-pay | Admitting: Vascular Surgery

## 2019-10-16 ENCOUNTER — Ambulatory Visit: Payer: Medicare FFS | Admitting: Vascular Surgery

## 2019-10-16 VITALS — BP 119/75 | HR 68 | Temp 97.7°F | Resp 20 | Ht 64.5 in | Wt 124.0 lb

## 2019-10-16 DIAGNOSIS — I728 Aneurysm of other specified arteries: Secondary | ICD-10-CM

## 2019-10-16 DIAGNOSIS — I714 Abdominal aortic aneurysm, without rupture, unspecified: Secondary | ICD-10-CM

## 2019-10-16 NOTE — Progress Notes (Signed)
REASON FOR CONSULT:    Saccular aneurysm.  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  I do not think this is the cause of her symptoms and it sounds like she has significant degenerative disc disease which explains her symptoms.  I explained that she is likely had this for some time and we would only consider elective repair if it enlarged.  If this did enlarge I think it could be addressed with a covered stent.  However given that she is 86 I would favor following this and only considering repair if it became symptomatic or enlarged.  I have ordered a follow-up CT of the abdomen pelvis in 9 months and I will see her back at that time.  Fortunately she is not a smoker.  Her blood pressures under good control.  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  Likewise given her age I would favor a conservative approach and will see if this is changed any when she has a follow-up CT in 9 months.  Again we would only consider elective repair if this enlarged significantly.  I think it could be addressed with coil embolization if this became necessary.  However again given her age I would not recommend an aggressive approach to this at this time.  I discussed this with the patient and her daughter on the phone.  All of their questions were answered.  Deitra Mayo, MD Office: [phone]   HPI:   Meghan Oconnell is a pleasant 86 y.o. female, who was seen at the [facility] emergency department on 09/07/2019 with left hip pain.  Work-up included a CT scan and an incidental finding was a small saccular aneurysm of the distal aorta.  This was not felt to be the cause of her pain.  The CT scan was done to look for an occult fracture.  Given this finding the patient was sent for vascular consultation.  On my history, the patient continues to  have some low back pain and left hip pain which radiates down her left leg.  This occurs with standing and sitting.  She denies any significant abdominal pain.  This has been fairly chronic and has not changed significantly.  She has no family history of aneurysmal disease.  She is not a smoker.  She does not have any history of hypertension.  She lives independently and is fairly active although currently her back pain limits her activity somewhat.  Past Medical History:  Diagnosis Date   Breast cancer ([place])     Family History  Problem Relation Age of Onset   Cancer Sister     SOCIAL HISTORY: Social History   Socioeconomic History   Marital status: Widowed    Spouse name: Not on file   Number of children: Not on file   Years of education: Not on file   Highest education level: Not on file  Occupational History   Not on file  Tobacco Use   Smoking status: Never Smoker   Smokeless tobacco: Never Used  Substance and Sexual Activity   Alcohol use: No    Alcohol/week: 0.0 standard drinks   Drug use: No   Sexual activity: Not on file  Other Topics Concern   Not on file  Social History Narrative   Not on file   Social Determinants of Health  Financial Resource Strain:    Difficulty of Paying Living Expenses: Not on file  Food Insecurity:    Worried About Charity fundraiser in the Last Year: Not on file   [employer] of Food in the Last Year: Not on file  Transportation Needs:    Lack of Transportation (Medical): Not on file   Lack of Transportation (Non-Medical): Not on file  Physical Activity:    Days of Exercise per Week: Not on file   Minutes of Exercise per Session: Not on file  Stress:    Feeling of Stress : Not on file  Social Connections:    Frequency of Communication with Friends and Family: Not on file   Frequency of Social Gatherings with Friends and Family: Not on file   Attends Religious Services: Not on file   Active Member of  [employer] or Organizations: Not on file   Attends Archivist Meetings: Not on file   Marital Status: Not on file  Intimate Partner Violence:    Fear of Current or Ex-Partner: Not on file   Emotionally Abused: Not on file   Physically Abused: Not on file   Sexually Abused: Not on file    No Known Allergies  Current Outpatient Medications  Medication Sig Dispense Refill   atorvastatin (LIPITOR) 20 MG tablet      baclofen (LIORESAL) 10 MG tablet Take 5 mg by mouth 2 (two) times daily as needed.     citalopram (CELEXA) 40 MG tablet Take 40 mg by mouth daily.     gabapentin (NEURONTIN) 300 MG capsule      hydrOXYzine (ATARAX/VISTARIL) 25 MG tablet Take 25 mg by mouth at bedtime.     Linaclotide (LINZESS) 145 MCG CAPS capsule Take 145 mcg by mouth daily.     meloxicam (MOBIC) 7.5 MG tablet Take 7.5 mg by mouth daily.     methocarbamol (ROBAXIN) 500 MG tablet Take 500 mg by mouth every 8 (eight) hours as needed for muscle spasms.      predniSONE (DELTASONE) 20 MG tablet Take 1 tablet (20 mg total) by mouth daily. 5 tablet 0   traMADol (ULTRAM) 50 MG tablet Take 1 tablet (50 mg total) by mouth every 8 (eight) hours as needed for moderate pain or severe pain. 15 tablet 0   No current facility-administered medications for this visit.    REVIEW OF SYSTEMS:  [X]  denotes positive finding, [ ]  denotes negative finding Cardiac  Comments:  Chest pain or chest pressure:    Shortness of breath upon exertion:    Short of breath when lying flat:    Irregular heart rhythm:        Vascular    Pain in calf, thigh, or hip brought on by ambulation:    Pain in feet at night that wakes you up from your sleep:     Blood clot in your veins:    Leg swelling:         Pulmonary    Oxygen at home:    Productive cough:     Wheezing:         Neurologic    Sudden weakness in arms or legs:     Sudden numbness in arms or legs:     Sudden onset of difficulty speaking or slurred  speech:    Temporary loss of vision in one eye:     Problems with dizziness:         Gastrointestinal    Blood in  stool:     Vomited blood:         Genitourinary    Burning when urinating:     Blood in urine:        Psychiatric    Major depression:         Hematologic    Bleeding problems:    Problems with blood clotting too easily:        Skin    Rashes or ulcers:        Constitutional    Fever or chills:     PHYSICAL EXAM:   Vitals:   10/16/19 1323  BP: 119/75  Pulse: 68  Resp: 20  Temp: 97.7 F (36.5 C)  SpO2: 97%  Weight: 124 lb (56.2 kg)  Height: 5' 4.5" (1.638 m)    GENERAL: The patient is a well-nourished female, in no acute distress. The vital signs are documented above. CARDIAC: There is a regular rate and rhythm.  VASCULAR: I do not detect carotid bruits. She has palpable femoral and pedal pulses. PULMONARY: There is good air exchange bilaterally without wheezing or rales. ABDOMEN: Soft and non-tender with normal pitched bowel sounds.  I do not palpate an abdominal aortic aneurysm. MUSCULOSKELETAL: There are no major deformities or cyanosis. NEUROLOGIC: No focal weakness or paresthesias are detected. SKIN: There are no ulcers or rashes noted. PSYCHIATRIC: The patient has a normal affect.  DATA:    CT ANGIOGRAM ABDOMEN PELVIS: I have reviewed the CT angiogram of the abdomen and pelvis that was done on 10/10/2019.  This shows in the distal abdominal aorta a thrombosed focal saccular aneurysm along the left anterior lateral wall just below the end very mesenteric artery.  There is no evidence of aortic dissection, intramural hematoma, or retroperitoneal hemorrhage.  This small saccular aneurysm is thrombosed.  It measures 1.7 x 1.3 cm.  Of note there is a 1.6 cm saccular aneurysm of the splenic artery which is patent.  LABS: Her GFR on 1-21 was 54.  Hemoglobin 15.0.  Platelets 253,000.

## 2021-03-10 ENCOUNTER — Other Ambulatory Visit: Payer: Self-pay

## 2021-03-10 ENCOUNTER — Ambulatory Visit (HOSPITAL_COMMUNITY): Payer: Medicare PPO | Attending: Physician Assistant | Admitting: Physical Therapy

## 2021-03-10 ENCOUNTER — Encounter (HOSPITAL_COMMUNITY): Payer: Self-pay | Admitting: Physical Therapy

## 2021-03-10 DIAGNOSIS — R29898 Other symptoms and signs involving the musculoskeletal system: Secondary | ICD-10-CM

## 2021-03-10 DIAGNOSIS — R2689 Other abnormalities of gait and mobility: Secondary | ICD-10-CM

## 2021-03-10 DIAGNOSIS — M6281 Muscle weakness (generalized): Secondary | ICD-10-CM | POA: Diagnosis present

## 2021-03-10 NOTE — Therapy (Signed)
[place], [place], [address] Phone: [phone]   Fax:  [phone]  Physical Therapy Evaluation  Patient Details  Name: Meghan Oconnell MRN: [id number] Date of Birth: 1931-01-04 Referring Provider (PT): Hildred Alamin PA-C   Encounter Date: 03/10/2021   PT End of Session - 03/10/21 1353     Visit Number 1    Number of Visits 8    Date for PT Re-Evaluation 04/07/21    Authorization Type [employer] Josem Kaufmann required) - check auth    Authorization - Visit Number 1    Authorization - Number of Visits 1    PT Start Time 0092    PT Stop Time 1350    PT Time Calculation (min) 35 min    Activity Tolerance Patient tolerated treatment well    Behavior During Therapy [facility] for tasks assessed/performed             Past Medical History:  Diagnosis Date   Breast cancer [facility])     Past Surgical History:  Procedure Laterality Date   BREAST LUMPECTOMY     CHOLECYSTECTOMY     HERNIA REPAIR     OTHER SURGICAL HISTORY     Colon Surgery    There were no vitals filed for this visit.    Subjective Assessment - 03/10/21 1316     Subjective Patient is a 87 y.o. female who presents to physical therapy s/p R femur fracture. She fell last fall when she lost her balance. She went to SNF for rehab. She had drop foot following surgery. Her home health PT stated she needed electrical therapy for her foot. She has no activation of ankle dorsiflexors. She loses her balance often. Her main goal is to get her foot straighter and get rid of drop foot. She has a stinging feeling in her lower leg sometimes.    Limitations Walking;House hold activities    Patient Stated Goals get her foot straighter and get rid of drop foot    Currently in Pain? No/denies                [facility] PT Assessment - 03/10/21 0001       Assessment   Medical Diagnosis Closed displaced fracture of R femur    Referring Provider (PT) Hildred Alamin PA-C     Onset Date/Surgical Date 06/16/20    Next MD Visit tomorrow    Prior Therapy SNF, HHPT      Precautions   Precautions Fall      Restrictions   Weight Bearing Restrictions No      Balance Screen   Has the patient fallen in the past 6 months Yes    How many times? 2    Has the patient had a decrease in activity level because of a fear of falling?  Yes    Is the patient reluctant to leave their home because of a fear of falling?  Yes      Prior Function   Level of Independence Independent    Vocation Retired      Charity fundraiser Status Within Functional Limits for tasks assessed      Observation/Other Assessments   Observations antalgic gait, RLE valgus, R AFO    Focus on Therapeutic Outcomes (FOTO)  n/a      ROM / Strength   AROM / PROM / Strength AROM;Strength      Strength   Strength Assessment Site Hip;Knee;Ankle  Right/Left hand Right;Left    Right/Left Hip Right;Left    Right Hip Flexion 4/5    Left Hip Flexion 4/5    Right/Left Knee Right;Left    Right Knee Flexion 5/5    Right Knee Extension 5/5    Left Knee Flexion 5/5    Left Knee Extension 5/5    Right/Left Ankle Right;Left    Right Ankle Dorsiflexion 0/5    Left Ankle Dorsiflexion 4+/5      Palpation   Palpation comment no palpable muscle contraction of tibialis anterior      Transfers   Comments slow, labored, use of hands, unsteady upon standing      Ambulation/Gait   Ambulation/Gait Yes    Ambulation/Gait Assistance 4: Min guard;5: Supervision    Ambulation Distance (Feet) 227 Feet    Assistive device None    Gait Pattern Right flexed knee in stance;Poor foot clearance - right    Ambulation Surface Level;Indoor    Gait Comments 2MWT      Standardized Balance Assessment   Standardized Balance Assessment Dynamic Gait Index      Dynamic Gait Index   Level Surface Moderate Impairment    Change in Gait Speed Moderate Impairment    Gait with Horizontal Head Turns Moderate  Impairment    Gait with Vertical Head Turns Moderate Impairment    Gait and Pivot Turn Moderate Impairment    Step Over Obstacle Moderate Impairment    Step Around Obstacles Moderate Impairment    Steps Moderate Impairment    Total Score 8    DGI comment: high risk for falls                        Objective measurements completed on examination: See above findings.               PT Education - 03/10/21 1316     Education Details Patient educated on exam findings, POC, scope of PT, use cane, walking program    Person(s) Educated Patient    Methods Explanation;Demonstration    Comprehension Verbalized understanding;Returned demonstration              PT Short Term Goals - 03/10/21 1355       PT SHORT TERM GOAL #1   Title Patient will be independent with HEP in order to improve functional outcomes.    Time 2    Period Weeks    Status New    Target Date 03/24/21      PT SHORT TERM GOAL #2   Title Patient will report at least 25% improvement in symptoms for improved quality of life.    Time 2    Period Weeks    Status New    Target Date 03/24/21               PT Long Term Goals - 03/10/21 1355       PT LONG TERM GOAL #1   Title Patient will report at least 75% improvement in symptoms for improved quality of life.    Time 4    Period Weeks    Status New    Target Date 04/07/21      PT LONG TERM GOAL #2   Title Patient will be able to ambulate at least 275 feet in 2MWT in order to demonstrate improved gait speed for community ambulation.    Time 4    Period Weeks    Status New  Target Date 04/07/21      PT LONG TERM GOAL #3   Title Patient will score at least 15/24 on DGI to indicate improved balance to reduce the risk for falls.    Time 4    Period Weeks    Status New    Target Date 04/07/21                    Plan - 03/10/21 1357     Clinical Impression Statement Patient is a 87 y.o. female who presents to  physical therapy s/p R femur fracture.  She presents with pain limited deficits in R hip strength, ROM, endurance, gait, balance, and functional mobility with ADL. She is having to modify and restrict ADL as indicated by DGI score as well as subjective information and objective measures which is affecting overall participation. Patient will benefit from skilled physical therapy in order to improve function and reduce impairment.    Personal Factors and Comorbidities Age;Fitness;Past/Current Experience;Comorbidity 2;Time since onset of injury/illness/exacerbation    Comorbidities foot drop, hx R hip fracture    Examination-Activity Limitations Locomotion Level;Transfers;Squat;Stairs;Stand;Bend    Examination-Participation Restrictions Cleaning;Community Activity;Shop;Volunteer;Yard Work    Merchant navy officer Stable/Uncomplicated    Designer, jewellery Low    Rehab Potential Fair    PT Frequency 2x / week    PT Duration 4 weeks    PT Treatment/Interventions ADLs/Self Care Home Management;Aquatic Therapy;Canalith Repostioning;Cryotherapy;Electrical Stimulation;Iontophoresis 4mg /ml Dexamethasone;Moist Heat;Traction;Ultrasound;DME Instruction;Gait training;Stair training;Functional mobility training;Therapeutic activities;Therapeutic exercise;Balance training;Neuromuscular re-education;Patient/family education;Orthotic Fit/Training;Manual techniques;Manual lymph drainage;Compression bandaging;Scar mobilization;Passive range of motion;Dry needling;Energy conservation;Splinting;Taping;Vasopneumatic Device    PT Next Visit Plan possibly begin NMES stim to R anterior tib, gait/balance training, functional strengthening    PT Home Exercise Plan walking, using SPC    Consulted and Agree with Plan of Care Patient             Patient will benefit from skilled therapeutic intervention in order to improve the following deficits and impairments:  Abnormal gait, Difficulty walking, Decreased  endurance, Decreased activity tolerance, Decreased balance, Improper body mechanics, Decreased mobility, Decreased strength  Visit Diagnosis: Other abnormalities of gait and mobility  Muscle weakness (generalized)  Other symptoms and signs involving the musculoskeletal system     Problem List Patient Active Problem List   Diagnosis Date Noted   Multinodular goiter 06/15/2015    2:00 PM, 03/10/21 Mearl Latin PT, DPT Physical Therapist at [place], [place], [address] Phone: [phone]   Fax:  [phone]  Name: Meghan Oconnell MRN: [id number] Date of Birth: 11/30/1930

## 2021-03-16 ENCOUNTER — Encounter (HOSPITAL_COMMUNITY): Payer: Self-pay

## 2021-03-16 ENCOUNTER — Other Ambulatory Visit: Payer: Self-pay

## 2021-03-16 ENCOUNTER — Ambulatory Visit (HOSPITAL_COMMUNITY): Payer: Medicare PPO

## 2021-03-16 DIAGNOSIS — R29898 Other symptoms and signs involving the musculoskeletal system: Secondary | ICD-10-CM

## 2021-03-16 DIAGNOSIS — R2689 Other abnormalities of gait and mobility: Secondary | ICD-10-CM

## 2021-03-16 DIAGNOSIS — M6281 Muscle weakness (generalized): Secondary | ICD-10-CM

## 2021-03-16 NOTE — Therapy (Addendum)
[place], [place], [address] Phone: [phone]   Fax:  [phone]  Physical Therapy Treatment  Patient Details  Name: Meghan Oconnell MRN: [id number] Date of Birth: [birth year] Referring Provider (PT): Hildred Alamin PA-C   Encounter Date: 03/16/2021   PT End of Session - 03/16/21 1403     Visit Number 2    Number of Visits 8    Date for PT Re-Evaluation 04/07/21    Authorization Type [employer] 8 visits approved    Authorization Time Period 7/6-->04/07/21    Authorization - Visit Number 2    Authorization - Number of Visits 8    Progress Note Due on Visit 8    PT Start Time 1402    PT Stop Time 1443    PT Time Calculation (min) 41 min    Activity Tolerance Patient tolerated treatment well    Behavior During Therapy [facility] for tasks assessed/performed             Past Medical History:  Diagnosis Date   Breast cancer [facility])     Past Surgical History:  Procedure Laterality Date   BREAST LUMPECTOMY     CHOLECYSTECTOMY     HERNIA REPAIR     OTHER SURGICAL HISTORY     Colon Surgery    There were no vitals filed for this visit.   Subjective Assessment - 03/16/21 1402     Subjective Pt arrived with daughter, no reports of pain or recent falls.  Pt brought copy of PT referral to include: hip/knee motion, leg/core strengthening, walking, balance, fall prevention, ankle/achilles stretching and strengthening.  ITB streching/massage, glut med strengthening and hip flexor stretching.     Limitations Walking;House hold activities    Patient Stated Goals get her foot straighter and get rid of drop foot    Currently in Pain? No/denies                               [facility] Adult PT Treatment/Exercise - 03/16/21 0001       Exercises   Exercises Knee/Hip      Knee/Hip Exercises: Seated   Other Seated Knee/Hip Exercises heel raises      Knee/Hip Exercises: Supine   Bridges 10 reps     Straight Leg Raises Both;10 reps      Knee/Hip Exercises: Sidelying   Hip ABduction Limitations difficult to keep form    Clams 10x      Modalities   Modalities Electrical Stimulation      Electrical Stimulation   Electrical Stimulation Location Rt anterior tib    Electrical Stimulation Action NMES    Electrical Stimulation Parameters Russian 10/30 cycle x 22min with LE supported on wedge    Electrical Stimulation Goals Neuromuscular facilitation;Strength                    PT Education - 03/16/21 1449     Education Details Reviewed goals, educated importance of HEP compliance for maximal benefits, use of cane for balance, purpose of NMES for strengthening.    Person(s) Educated Patient    Methods Explanation;Demonstration    Comprehension Verbalized understanding;Returned demonstration              PT Short Term Goals - 03/10/21 1355       PT SHORT TERM GOAL #1   Title Patient will be independent with HEP in order to improve  functional outcomes.    Time 2    Period Weeks    Status New    Target Date 03/24/21      PT SHORT TERM GOAL #2   Title Patient will report at least 25% improvement in symptoms for improved quality of life.    Time 2    Period Weeks    Status New    Target Date 03/24/21               PT Long Term Goals - 03/10/21 1355       PT LONG TERM GOAL #1   Title Patient will report at least 75% improvement in symptoms for improved quality of life.    Time 4    Period Weeks    Status New    Target Date 04/07/21      PT LONG TERM GOAL #2   Title Patient will be able to ambulate at least 275 feet in 2MWT in order to demonstrate improved gait speed for community ambulation.    Time 4    Period Weeks    Status New    Target Date 04/07/21      PT LONG TERM GOAL #3   Title Patient will score at least 15/24 on DGI to indicate improved balance to reduce the risk for falls.    Time 4    Period Weeks    Status New    Target  Date 04/07/21                   Plan - 03/16/21 1454     Clinical Impression Statement Pt arrived with daughter for session.  Began session reviewing goals, educated importance of HEP compliance for maximal benefits that was established this session.  Attempted NMES with [place] estim for Rt foot drop, unable to acheive any dorsiflexion.  Did present with inversion but no dorsiflexion wiht electrode at different anterior tib spots.  Therex focus on hip strengthening with HEP established, printout given with verbalized understanding and ability to demonstrate correctly.  Pt arrived without AD, tendency to reach for daugther while walking, encouraged to use [facility] for safety.    Personal Factors and Comorbidities Age;Fitness;Past/Current Experience;Comorbidity 2;Time since onset of injury/illness/exacerbation    Comorbidities foot drop, hx R hip fracture    Examination-Activity Limitations Locomotion Level;Transfers;Squat;Stairs;Stand;Bend    Examination-Participation Restrictions Cleaning;Community Activity;Shop;Volunteer;Yard Work    Merchant navy officer Stable/Uncomplicated    Designer, jewellery Low    Rehab Potential Fair    PT Frequency 2x / week    PT Duration 4 weeks    PT Treatment/Interventions ADLs/Self Care Home Management;Aquatic Therapy;Canalith Repostioning;Cryotherapy;Electrical Stimulation;Iontophoresis 4mg /ml Dexamethasone;Moist Heat;Traction;Ultrasound;DME Instruction;Gait training;Stair training;Functional mobility training;Therapeutic activities;Therapeutic exercise;Balance training;Neuromuscular re-education;Patient/family education;Orthotic Fit/Training;Manual techniques;Manual lymph drainage;Compression bandaging;Scar mobilization;Passive range of motion;Dry needling;Energy conservation;Splinting;Taping;Vasopneumatic Device    PT Next Visit Plan possibly begin NMES stim to R anterior tib, gait/balance training, functional strengthening    PT Home  Exercise Plan walking, using SPC; 7/12: heel raise, bridge, SLR, sidelying clam    Consulted and Agree with Plan of Care Patient             Patient will benefit from skilled therapeutic intervention in order to improve the following deficits and impairments:  Abnormal gait, Difficulty walking, Decreased endurance, Decreased activity tolerance, Decreased balance, Improper body mechanics, Decreased mobility, Decreased strength  Visit Diagnosis: Other abnormalities of gait and mobility  Muscle weakness (generalized)  Other symptoms and signs involving the musculoskeletal system  Problem List Patient Active Problem List   Diagnosis Date Noted   Multinodular goiter 06/15/2015   Ihor Austin, LPTA/CLT; CBIS [phone]  Aldona Lento 03/16/2021, [place] [address] [place], [place], [address] Phone: [phone]   Fax:  [phone]  Name: Letricia Krinsky MRN: [id number] Date of Birth: 06/02/1931

## 2021-03-18 ENCOUNTER — Telehealth (HOSPITAL_COMMUNITY): Payer: Self-pay

## 2021-03-18 NOTE — Telephone Encounter (Signed)
Unable to reach mother, called to offer earlier apt.    Ihor Austin, LPTA/CLT; Delana Meyer [phone]

## 2021-03-19 ENCOUNTER — Other Ambulatory Visit: Payer: Self-pay

## 2021-03-19 ENCOUNTER — Ambulatory Visit (HOSPITAL_COMMUNITY): Payer: Medicare PPO

## 2021-03-19 ENCOUNTER — Encounter (HOSPITAL_COMMUNITY): Payer: Self-pay

## 2021-03-19 DIAGNOSIS — R29898 Other symptoms and signs involving the musculoskeletal system: Secondary | ICD-10-CM

## 2021-03-19 DIAGNOSIS — M6281 Muscle weakness (generalized): Secondary | ICD-10-CM

## 2021-03-19 DIAGNOSIS — R2689 Other abnormalities of gait and mobility: Secondary | ICD-10-CM | POA: Diagnosis not present

## 2021-03-19 NOTE — Therapy (Signed)
[place], [place], [address] Phone: [phone]   Fax:  [phone]  Physical Therapy Treatment  Patient Details  Name: Meghan Oconnell MRN: [id number] Date of Birth: 1930/12/04 Referring Provider (PT): Hildred Alamin PA-C   Encounter Date: 03/19/2021   PT End of Session - 03/19/21 1413     Visit Number 3    Number of Visits 8    Date for PT Re-Evaluation 04/07/21    Authorization Type [employer] Medicare 8 visits approved    Authorization Time Period 7/6-->04/07/21    Authorization - Visit Number 3    Authorization - Number of Visits 8    Progress Note Due on Visit 8    PT Start Time 1320    PT Stop Time 1358    PT Time Calculation (min) 38 min    Activity Tolerance Patient tolerated treatment well;Patient limited by fatigue    Behavior During Therapy [facility] for tasks assessed/performed             Past Medical History:  Diagnosis Date   Breast cancer [facility])     Past Surgical History:  Procedure Laterality Date   BREAST LUMPECTOMY     CHOLECYSTECTOMY     HERNIA REPAIR     OTHER SURGICAL HISTORY     Colon Surgery    There were no vitals filed for this visit.   Subjective Assessment - 03/19/21 1343     Subjective Pt arrived with daughter, forgot cane again.  Stated she has began the HEP without difficulty, is tiring.    Patient Stated Goals get her foot straighter and get rid of drop foot    Currently in Pain? Yes    Pain Score 7     Pain Location Back    Pain Orientation Lower    Pain Descriptors / Indicators Sharp    Pain Type Chronic pain    Pain Onset More than a month ago    Pain Frequency Intermittent    Aggravating Factors  unsure    Pain Relieving Factors tylonel                               [facility] Adult PT Treatment/Exercise - 03/19/21 0001       Ambulation/Gait   Ambulation/Gait Assistance 4: Min guard    Ambulation Distance (Feet) 226 Feet    Assistive  device Straight cane    Gait Pattern Right flexed knee in stance;Poor foot clearance - right    Ambulation Surface Level;Indoor    Gait Comments Good sequence, improve stability with cane      Exercises   Exercises Knee/Hip      Knee/Hip Exercises: Standing   Heel Raises 2 sets;10 reps    Hip Abduction 2 sets;10 reps;Knee straight    Hip Extension 2 sets;10 reps;Knee straight    Gait Training Gait training wiht SPC    Other Standing Knee Exercises tandem stance 2x 30" intermittent HHA      Knee/Hip Exercises: Seated   Other Seated Knee/Hip Exercises PROM/AAROM dorsiflexion    Sit to Sand 2 sets;5 reps;without UE support      Modalities   Modalities Teacher, English as a foreign language Location Rt anterior tib    Printmaker Action NMES    Electrical Stimulation Parameters Russian 10/30 x70min with [facility]    Electrical Stimulation Goals Neuromuscular facilitation;Strength  PT Short Term Goals - 03/10/21 1355       PT SHORT TERM GOAL #1   Title Patient will be independent with HEP in order to improve functional outcomes.    Time 2    Period Weeks    Status New    Target Date 03/24/21      PT SHORT TERM GOAL #2   Title Patient will report at least 25% improvement in symptoms for improved quality of life.    Time 2    Period Weeks    Status New    Target Date 03/24/21               PT Long Term Goals - 03/10/21 1355       PT LONG TERM GOAL #1   Title Patient will report at least 75% improvement in symptoms for improved quality of life.    Time 4    Period Weeks    Status New    Target Date 04/07/21      PT LONG TERM GOAL #2   Title Patient will be able to ambulate at least 275 feet in 2MWT in order to demonstrate improved gait speed for community ambulation.    Time 4    Period Weeks    Status New    Target Date 04/07/21      PT LONG TERM GOAL #3   Title Patient will  score at least 15/24 on DGI to indicate improved balance to reduce the risk for falls.    Time 4    Period Weeks    Status New    Target Date 04/07/21                   Plan - 03/19/21 1417     Clinical Impression Statement Pt continues to present with inversion motion during Russian NMES on anterior tib, no dorsiflexion.  Progressed to hip and functioanl strengthening exercises with min cueing for form/mechanics initially.  Pt arrived without cane, educated benefits with stability during gait for fall prevention and pain control.  EOS following gait training pt reports no LBP.    Personal Factors and Comorbidities Age;Fitness;Past/Current Experience;Comorbidity 2;Time since onset of injury/illness/exacerbation    Comorbidities foot drop, hx R hip fracture    Examination-Activity Limitations Locomotion Level;Transfers;Squat;Stairs;Stand;Bend    Examination-Participation Restrictions Cleaning;Community Activity;Shop;Volunteer;Yard Work    Merchant navy officer Stable/Uncomplicated    Designer, jewellery Low    Rehab Potential Fair    PT Frequency 2x / week    PT Duration 4 weeks    PT Treatment/Interventions ADLs/Self Care Home Management;Aquatic Therapy;Canalith Repostioning;Cryotherapy;Electrical Stimulation;Iontophoresis 4mg /ml Dexamethasone;Moist Heat;Traction;Ultrasound;DME Instruction;Gait training;Stair training;Functional mobility training;Therapeutic activities;Therapeutic exercise;Balance training;Neuromuscular re-education;Patient/family education;Orthotic Fit/Training;Manual techniques;Manual lymph drainage;Compression bandaging;Scar mobilization;Passive range of motion;Dry needling;Energy conservation;Splinting;Taping;Vasopneumatic Device    PT Next Visit Plan Gait training wiht SPC, hip strengthening, balance training, functional strengthening.    PT Home Exercise Plan walking, using SPC; 7/12: heel raise, bridge, SLR, sidelying clam    Consulted and  Agree with Plan of Care Patient;Family member/caregiver    Family Member Consulted daughter             Patient will benefit from skilled therapeutic intervention in order to improve the following deficits and impairments:  Abnormal gait, Difficulty walking, Decreased endurance, Decreased activity tolerance, Decreased balance, Improper body mechanics, Decreased mobility, Decreased strength  Visit Diagnosis: Other abnormalities of gait and mobility  Muscle weakness (generalized)  Other symptoms and signs involving the musculoskeletal system  Problem List Patient Active Problem List   Diagnosis Date Noted   Multinodular goiter 06/15/2015   Ihor Austin, LPTA/CLT; CBIS [phone]  Aldona Lento 03/19/2021, 2:30 PM  [place] [address] [place], [place], [address] Phone: [phone]   Fax:  [phone]  Name: Meghan Oconnell MRN: [id number] Date of Birth: 10-30-1930

## 2021-03-24 ENCOUNTER — Encounter (HOSPITAL_COMMUNITY): Payer: Self-pay

## 2021-03-24 ENCOUNTER — Other Ambulatory Visit: Payer: Self-pay

## 2021-03-24 ENCOUNTER — Ambulatory Visit (HOSPITAL_COMMUNITY): Payer: Medicare PPO

## 2021-03-24 ENCOUNTER — Telehealth (HOSPITAL_COMMUNITY): Payer: Self-pay

## 2021-03-24 DIAGNOSIS — R2689 Other abnormalities of gait and mobility: Secondary | ICD-10-CM

## 2021-03-24 DIAGNOSIS — R29898 Other symptoms and signs involving the musculoskeletal system: Secondary | ICD-10-CM

## 2021-03-24 DIAGNOSIS — M6281 Muscle weakness (generalized): Secondary | ICD-10-CM

## 2021-03-24 NOTE — Therapy (Signed)
[facility] [address] [place], [place], [address] Phone: [phone]   Fax:  [phone]  Physical Therapy Treatment  Patient Details  Name: Meghan Oconnell MRN: [id number] Date of Birth: 04-19-1931 Referring Provider (PT): Hildred Alamin PA-C   Encounter Date: 03/24/2021   PT End of Session - 03/24/21 1135     Visit Number 4    Number of Visits 8    Date for PT Re-Evaluation 04/07/21    Authorization Type [employer] 8 visits approved    Authorization Time Period 7/6-->04/07/21    Authorization - Visit Number 4    Authorization - Number of Visits 8    Progress Note Due on Visit 8    PT Start Time 1130    PT Stop Time 1212    PT Time Calculation (min) 42 min    Equipment Utilized During Treatment Gait belt    Activity Tolerance Patient tolerated treatment well;Patient limited by fatigue    Behavior During Therapy WFL for tasks assessed/performed             Past Medical History:  Diagnosis Date   Breast cancer [facility])     Past Surgical History:  Procedure Laterality Date   BREAST LUMPECTOMY     CHOLECYSTECTOMY     HERNIA REPAIR     OTHER SURGICAL HISTORY     Colon Surgery    There were no vitals filed for this visit.   Subjective Assessment - 03/24/21 1132     Subjective Pt arrived with daughter and ambulating wiht SPC.  Reports she has pain in mid back today, pain scale 8/10.    Patient Stated Goals get her foot straighter and get rid of drop foot    Currently in Pain? Yes    Pain Score 8     Pain Location Back    Pain Orientation Mid;Lower    Pain Descriptors / Indicators Aching;Burning;Sore    Pain Type Chronic pain    Pain Onset More than a month ago    Pain Frequency Intermittent    Aggravating Factors  unsure    Pain Relieving Factors aleve, tylonel.                               [facility] Adult PT Treatment/Exercise - 03/24/21 0001       Exercises   Exercises Knee/Hip       Knee/Hip Exercises: Standing   Heel Raises 2 sets;10 reps    Hip Abduction 2 sets;10 reps;Knee straight    Forward Step Up Right;2 sets;10 reps;Hand Hold: 2;Hand Hold: 1;Step Height: 4";Step Height: 6"    Rocker Board 2 minutes    Rocker Board Limitations lateral with HHA; DF/PF    Gait Training Ambulated with SPC through session    Other Standing Knee Exercises tandem stance on foam 2x 30"    Other Standing Knee Exercises retro gait and sidestep 2RT      Knee/Hip Exercises: Seated   Other Seated Knee/Hip Exercises mirror feedback for dorsiflexion x 8 min    Sit to Sand 10 reps;without UE support                      PT Short Term Goals - 03/10/21 1355       PT SHORT TERM GOAL #1   Title Patient will be independent with HEP in order to improve functional outcomes.  Time 2    Period Weeks    Status New    Target Date 03/24/21      PT SHORT TERM GOAL #2   Title Patient will report at least 25% improvement in symptoms for improved quality of life.    Time 2    Period Weeks    Status New    Target Date 03/24/21               PT Long Term Goals - 03/10/21 1355       PT LONG TERM GOAL #1   Title Patient will report at least 75% improvement in symptoms for improved quality of life.    Time 4    Period Weeks    Status New    Target Date 04/07/21      PT LONG TERM GOAL #2   Title Patient will be able to ambulate at least 275 feet in 2MWT in order to demonstrate improved gait speed for community ambulation.    Time 4    Period Weeks    Status New    Target Date 04/07/21      PT LONG TERM GOAL #3   Title Patient will score at least 15/24 on DGI to indicate improved balance to reduce the risk for falls.    Time 4    Period Weeks    Status New    Target Date 04/07/21                   Plan - 03/24/21 1217     Clinical Impression Statement Session focus with functional strengthening and balalnce training.  Added step up for quad  strengthening and static as well as dynamic balalnce activities.  Trial with mirror feedback with some movement Rt LE, do feel compensation with extensors but still some movement.  Continue next session without AFO.    Personal Factors and Comorbidities Age;Fitness;Past/Current Experience;Comorbidity 2;Time since onset of injury/illness/exacerbation    Comorbidities foot drop, hx R hip fracture    Examination-Activity Limitations Locomotion Level;Transfers;Squat;Stairs;Stand;Bend    Examination-Participation Restrictions Cleaning;Community Activity;Shop;Volunteer;Yard Work    [employer]chant [employer] Stable/Uncomplicated    Designer, jewellery Low    Rehab Potential Fair    PT Frequency 2x / week    PT Duration 4 weeks    PT Treatment/Interventions ADLs/Self Care Home Management;Aquatic Therapy;Canalith Repostioning;Cryotherapy;Electrical Stimulation;Iontophoresis 4mg /ml Dexamethasone;Moist Heat;Traction;Ultrasound;DME Instruction;Gait training;Stair training;Functional mobility training;Therapeutic activities;Therapeutic exercise;Balance training;Neuromuscular re-education;Patient/family education;Orthotic Fit/Training;Manual techniques;Manual lymph drainage;Compression bandaging;Scar mobilization;Passive range of motion;Dry needling;Energy conservation;Splinting;Taping;Vasopneumatic Device    PT Next Visit Plan Gait training wiht SPC, hip strengthening, balance training, functional strengthening.  Trail mirror feedback without AFO.  Add theraband resistance around thigh with sidestep.    PT Home Exercise Plan walking, using SPC; 7/12: heel raise, bridge, SLR, sidelying clam    Consulted and Agree with Plan of Care Patient;Family member/caregiver    Family Member Consulted daughter             Patient will benefit from skilled therapeutic intervention in order to improve the following deficits and impairments:  Abnormal gait, Difficulty walking, Decreased endurance, Decreased  activity tolerance, Decreased balance, Improper body mechanics, Decreased mobility, Decreased strength  Visit Diagnosis: Other abnormalities of gait and mobility  Muscle weakness (generalized)  Other symptoms and signs involving the musculoskeletal system     Problem List Patient Active Problem List   Diagnosis Date Noted   Multinodular goiter 06/15/2015   Ihor Austin, LPTA/CLT; CBIS [phone]  Ihor Austin  Denice Paradise 03/24/2021, 1:03 PM  [place], [place], [address] Phone: [phone]   Fax:  [phone]  Name: Polina Burmaster MRN: [id number] Date of Birth: 05-Oct-1930

## 2021-03-26 ENCOUNTER — Other Ambulatory Visit: Payer: Self-pay

## 2021-03-26 ENCOUNTER — Ambulatory Visit (HOSPITAL_COMMUNITY): Payer: Medicare PPO | Admitting: Physical Therapy

## 2021-03-26 ENCOUNTER — Encounter (HOSPITAL_COMMUNITY): Payer: Self-pay | Admitting: Physical Therapy

## 2021-03-26 DIAGNOSIS — R29898 Other symptoms and signs involving the musculoskeletal system: Secondary | ICD-10-CM

## 2021-03-26 DIAGNOSIS — M6281 Muscle weakness (generalized): Secondary | ICD-10-CM

## 2021-03-26 DIAGNOSIS — R2689 Other abnormalities of gait and mobility: Secondary | ICD-10-CM | POA: Diagnosis not present

## 2021-03-26 NOTE — Therapy (Signed)
[place], [place], [address] Phone: [phone]   Fax:  [phone]  Physical Therapy Treatment  Patient Details  Name: Meghan Oconnell MRN: [id number] Date of Birth: 1930/11/14 Referring Provider (PT): Hildred Alamin PA-C   Encounter Date: 03/26/2021   PT End of Session - 03/26/21 1403     Visit Number 5    Number of Visits 8    Date for PT Re-Evaluation 04/07/21    Authorization Type [employer] 8 visits approved    Authorization Time Period 7/6-->04/07/21    Authorization - Visit Number 5    Authorization - Number of Visits 8    Progress Note Due on Visit 8    PT Start Time [id number]    PT Stop Time 1441    PT Time Calculation (min) 38 min    Equipment Utilized During Treatment Gait belt    Activity Tolerance Patient tolerated treatment well;Patient limited by fatigue    Behavior During Therapy WFL for tasks assessed/performed             Past Medical History:  Diagnosis Date   Breast cancer [facility])     Past Surgical History:  Procedure Laterality Date   BREAST LUMPECTOMY     CHOLECYSTECTOMY     HERNIA REPAIR     OTHER SURGICAL HISTORY     Colon Surgery    There were no vitals filed for this visit.       [facility] PT Assessment - 03/26/21 0001       Assessment   Medical Diagnosis Closed displaced fracture of R femur    Referring Provider (PT) Hildred Alamin PA-C    Onset Date/Surgical Date 06/16/20                           [facility] Adult PT Treatment/Exercise - 03/26/21 0001       Ambulation/Gait   Gait Comments walking in // bars with cues for bigger steps and looking straight ahead 10 minutes      Knee/Hip Exercises: Stretches   Other Knee/Hip Stretches lumbar flexion x10 10"holds; side flexion 3x10 5" holds B seated      Knee/Hip Exercises: Standing   Other Standing Knee Exercises foot taps on 4" step 2x10 B; staggered step balance x4 30" holds each                     PT Education - 03/26/21 1443     Education Details on HEP and exercises.    Person(s) Educated Patient    Methods Explanation    Comprehension Verbalized understanding              PT Short Term Goals - 03/10/21 1355       PT SHORT TERM GOAL #1   Title Patient will be independent with HEP in order to improve functional outcomes.    Time 2    Period Weeks    Status New    Target Date 03/24/21      PT SHORT TERM GOAL #2   Title Patient will report at least 25% improvement in symptoms for improved quality of life.    Time 2    Period Weeks    Status New    Target Date 03/24/21               PT Long Term Goals - 03/10/21 1355  PT LONG TERM GOAL #1   Title Patient will report at least 75% improvement in symptoms for improved quality of life.    Time 4    Period Weeks    Status New    Target Date 04/07/21      PT LONG TERM GOAL #2   Title Patient will be able to ambulate at least 275 feet in 2MWT in order to demonstrate improved gait speed for community ambulation.    Time 4    Period Weeks    Status New    Target Date 04/07/21      PT LONG TERM GOAL #3   Title Patient will score at least 15/24 on DGI to indicate improved balance to reduce the risk for falls.    Time 4    Period Weeks    Status New    Target Date 04/07/21                   Plan - 03/26/21 1442     Clinical Impression Statement Assessed mirror therapy foot intervention from previous session and patient using hip flexors and inverters to try and move foot, no palpable muscle activation in anterior tibialis or extensors on right leg. Increased back pain noted with standing balance exercises but this was reduced with seated lumbar flexion stretch. Practiced walking with upright posture and longer strides. Added lumbar stretches to HEP.    Personal Factors and Comorbidities Age;Fitness;Past/Current Experience;Comorbidity 2;Time since onset of  injury/illness/exacerbation    Comorbidities foot drop, hx R hip fracture    Examination-Activity Limitations Locomotion Level;Transfers;Squat;Stairs;Stand;Bend    Examination-Participation Restrictions Cleaning;Community Activity;Shop;Volunteer;Yard Work    Stability/Clinical Decision Making Stable/Uncomplicated    Rehab Potential Fair    PT Frequency 2x / week    PT Duration 4 weeks    PT Treatment/Interventions ADLs/Self Care Home Management;Aquatic Therapy;Canalith Repostioning;Cryotherapy;Electrical Stimulation;Iontophoresis '4mg'$ /ml Dexamethasone;Moist Heat;Traction;Ultrasound;DME Instruction;Gait training;Stair training;Functional mobility training;Therapeutic activities;Therapeutic exercise;Balance training;Neuromuscular re-education;Patient/family education;Orthotic Fit/Training;Manual techniques;Manual lymph drainage;Compression bandaging;Scar mobilization;Passive range of motion;Dry needling;Energy conservation;Splinting;Taping;Vasopneumatic Device    PT Next Visit Plan Gait training wiht SPC, hip strengthening, balance training, functional strengthening.  Trail mirror feedback without AFO.  Add theraband resistance around thigh with sidestep.    PT Home Exercise Plan walking, using SPC; 7/12: heel raise, bridge, SLR, sidelying clam; lumbar flexion/SB seated    Consulted and Agree with Plan of Care Patient;Family member/caregiver    Family Member Consulted daughter             Patient will benefit from skilled therapeutic intervention in order to improve the following deficits and impairments:  Abnormal gait, Difficulty walking, Decreased endurance, Decreased activity tolerance, Decreased balance, Improper body mechanics, Decreased mobility, Decreased strength  Visit Diagnosis: Other abnormalities of gait and mobility  Muscle weakness (generalized)  Other symptoms and signs involving the musculoskeletal system     Problem List Patient Active Problem List   Diagnosis Date  Noted   Multinodular goiter 06/15/2015   2:44 PM, 03/26/21 Jerene Pitch, DPT Physical Therapy with [facility]  [phone] office   [place], [place], [address] Phone: [phone]   Fax:  [phone]  Name: Meghan Oconnell MRN: [id number] Date of Birth: [birth year]

## 2021-03-29 ENCOUNTER — Other Ambulatory Visit: Payer: Self-pay

## 2021-03-29 ENCOUNTER — Ambulatory Visit (HOSPITAL_COMMUNITY): Payer: Medicare PPO

## 2021-03-29 DIAGNOSIS — M6281 Muscle weakness (generalized): Secondary | ICD-10-CM

## 2021-03-29 DIAGNOSIS — R2689 Other abnormalities of gait and mobility: Secondary | ICD-10-CM

## 2021-03-29 DIAGNOSIS — R29898 Other symptoms and signs involving the musculoskeletal system: Secondary | ICD-10-CM

## 2021-03-29 NOTE — Therapy (Signed)
[place]con[place], [place], [address] Phone: [phone]   Fax:  [phone]  Physical Therapy Treatment  Patient Details  Name: Meghan Oconnell MRN: [id number] Date of Birth: 1931/01/19 Referring Provider (PT): Hildred Alamin PA-C   Encounter Date: 03/29/2021   PT End of Session - 03/29/21 1644     Visit Number 6    Number of Visits 8    Date for PT Re-Evaluation 04/07/21    Authorization Type [employer] 8 visits approved    Authorization Time Period 7/6-->04/07/21    Authorization - Visit Number 6    Authorization - Number of Visits 8    Progress Note Due on Visit 8    PT Start Time 1645    PT Stop Time 1730    PT Time Calculation (min) 45 min    Equipment Utilized During Treatment Gait belt    Activity Tolerance Patient tolerated treatment well;Patient limited by fatigue    Behavior During Therapy [facility] for tasks assessed/performed             Past Medical History:  Diagnosis Date   Breast cancer [facility])     Past Surgical History:  Procedure Laterality Date   BREAST LUMPECTOMY     CHOLECYSTECTOMY     HERNIA REPAIR     OTHER SURGICAL HISTORY     Colon Surgery    There were no vitals filed for this visit.   Subjective Assessment - 03/29/21 1738     Subjective Pt reports she has appointment with foot/ankle specialist tomorrow for assessment of foot drop    Patient Stated Goals get her foot straighter and get rid of drop foot    Pain Onset More than a month ago                [facility] PT Assessment - 03/29/21 0001       Assessment   Medical Diagnosis Closed displaced fracture of R femur    Referring Provider (PT) Hildred Alamin PA-C    Onset Date/Surgical Date 06/16/20                           [facility] Adult PT Treatment/Exercise - 03/29/21 0001       Knee/Hip Exercises: Standing   Lateral Step Up Both;3 sets;5 reps    Lateral Step Up Limitations sidestepping over 6"  hurdles    Gait Training tandem stance walk "tightrope"    Other Standing Knee Exercises ant-post weight shifting on towel roll for ankle strategy 2x2 min    Other Standing Knee Exercises resisted retro-forward  2x2 min 3 plates.                    PT Education - 03/29/21 1739     Education Details pt and her daughter educated on different AFO options that may help reduce/elminate her steppage gait pattern    Person(s) Educated Patient    Methods Explanation    Comprehension Verbalized understanding              PT Short Term Goals - 03/10/21 1355       PT SHORT TERM GOAL #1   Title Patient will be independent with HEP in order to improve functional outcomes.    Time 2    Period Weeks    Status New    Target Date 03/24/21      PT SHORT TERM GOAL #2  Title Patient will report at least 25% improvement in symptoms for improved quality of life.    Time 2    Period Weeks    Status New    Target Date 03/24/21               PT Long Term Goals - 03/10/21 1355       PT LONG TERM GOAL #1   Title Patient will report at least 75% improvement in symptoms for improved quality of life.    Time 4    Period Weeks    Status New    Target Date 04/07/21      PT LONG TERM GOAL #2   Title Patient will be able to ambulate at least 275 feet in 2MWT in order to demonstrate improved gait speed for community ambulation.    Time 4    Period Weeks    Status New    Target Date 04/07/21      PT LONG TERM GOAL #3   Title Patient will score at least 15/24 on DGI to indicate improved balance to reduce the risk for falls.    Time 4    Period Weeks    Status New    Target Date 04/07/21                   Plan - 03/29/21 1740     Clinical Impression Statement Exhibits steppage gait pattern with RLE due to inadequate foot clearance in swing phase. Pt uses posterior leaf spring AFO with 3/4 length foot bed, this may be insufficient to accommodate her foot drop and  may be better served by a more supportive design. Demonstrated delayed/present righting reactions with increased retro-LOB with postural perturbations. Continued POC indicated to improve gait/balance strategies to normalize pattern and reduce risk for falls    Personal Factors and Comorbidities Age;Fitness;Past/Current Experience;Comorbidity 2;Time since onset of injury/illness/exacerbation    Examination-Activity Limitations Locomotion Level;Transfers;Squat;Stairs;Stand;Bend    Examination-Participation Restrictions Cleaning;Community Activity;Shop;Volunteer;Yard Work    [employer] Potential Fair    PT Frequency 2x / week    PT Duration 4 weeks    PT Treatment/Interventions ADLs/Self Care Home Management;Aquatic Therapy;Canalith Repostioning;Cryotherapy;Electrical Stimulation;Iontophoresis '4mg'$ /ml Dexamethasone;Moist Heat;Traction;Ultrasound;DME Instruction;Gait training;Stair training;Functional mobility training;Therapeutic activities;Therapeutic exercise;Balance training;Neuromuscular re-education;Patient/family education;Orthotic Fit/Training;Manual techniques;Manual lymph drainage;Compression bandaging;Scar mobilization;Passive range of motion;Dry needling;Energy conservation;Splinting;Taping;Vasopneumatic Device    PT Next Visit Plan Gait training wiht SPC, hip strengthening, balance training, functional strengthening.  Trail mirror feedback without AFO.  Add theraband resistance around thigh with sidestep.    PT Home Exercise Plan walking, using SPC; 7/12: heel raise, bridge, SLR, sidelying clam; lumbar flexion/SB seated    Consulted and Agree with Plan of Care Patient;Family member/caregiver    Family Member Consulted daughter             Patient will benefit from skilled therapeutic intervention in order to improve the following deficits and impairments:  Abnormal gait, Difficulty walking, Decreased endurance, Decreased activity tolerance, Decreased balance, Improper body mechanics, Decreased  mobility, Decreased strength  Visit Diagnosis: Other abnormalities of gait and mobility  Muscle weakness (generalized)  Other symptoms and signs involving the musculoskeletal system     Problem List Patient Active Problem List   Diagnosis Date Noted   Multinodular goiter 06/15/2015   5:43 PM, 03/29/21 M. Sherlyn Lees, PT, DPT Physical Therapist- [place] Office Number: [phone]   [place] [address] [place], [place], [address] Phone: [phone]   Fax:  [phone]  Name: Lillyahna Pulkrabek MRN: [id number] Date of Birth: 10/04/1930

## 2021-03-31 ENCOUNTER — Encounter (HOSPITAL_COMMUNITY): Payer: Self-pay | Admitting: Physical Therapy

## 2021-03-31 ENCOUNTER — Other Ambulatory Visit: Payer: Self-pay

## 2021-03-31 ENCOUNTER — Ambulatory Visit (HOSPITAL_COMMUNITY): Payer: Medicare PPO | Admitting: Physical Therapy

## 2021-03-31 DIAGNOSIS — R2689 Other abnormalities of gait and mobility: Secondary | ICD-10-CM | POA: Diagnosis not present

## 2021-03-31 DIAGNOSIS — M6281 Muscle weakness (generalized): Secondary | ICD-10-CM

## 2021-03-31 DIAGNOSIS — R29898 Other symptoms and signs involving the musculoskeletal system: Secondary | ICD-10-CM

## 2021-03-31 NOTE — Therapy (Signed)
[place], [place], [address] Phone: [phone]   Fax:  [phone]  Physical Therapy Treatment  Patient Details  Name: Meghan Oconnell MRN: [id number] Date of Birth: [birth year] Referring Provider (PT): Hildred Alamin PA-C   Encounter Date: 03/31/2021   PT End of Session - 03/31/21 1355     Visit Number 7    Number of Visits 8    Date for PT Re-Evaluation 04/07/21    Authorization Type [employer] 8 visits approved    Authorization Time Period 7/6-->04/07/21    Authorization - Visit Number 7    Authorization - Number of Visits 8    Progress Note Due on Visit 8    PT Start Time [id number]    PT Stop Time 1430    PT Time Calculation (min) 42 min    Equipment Utilized During Treatment Gait belt    Activity Tolerance Patient tolerated treatment well;Patient limited by fatigue    Behavior During Therapy [facility] for tasks assessed/performed             Past Medical History:  Diagnosis Date   Breast cancer [facility] [place])     Past Surgical History:  Procedure Laterality Date   BREAST LUMPECTOMY     CHOLECYSTECTOMY     HERNIA REPAIR     OTHER SURGICAL HISTORY     Colon Surgery    There were no vitals filed for this visit.   Subjective Assessment - 03/31/21 1353     Subjective No new issues. Getting along well. Back is a little sore today.    Patient Stated Goals get her foot straighter and get rid of drop foot    Currently in Pain? Yes    Pain Score 5     Pain Location Back    Pain Orientation Lower;Posterior    Pain Descriptors / Indicators Aching;Sore    Pain Type Chronic pain    Pain Onset More than a month ago    Pain Frequency Constant                               [facility] Adult PT Treatment/Exercise - 03/31/21 0001       Knee/Hip Exercises: Aerobic   Nustep 4 min dynamic warmup lv 1      Knee/Hip Exercises: Standing   Heel Raises 2 sets;10 reps    Hip Flexion Both;20 reps     Hip Flexion Limitations 2#    Hip Abduction Both;2 sets;10 reps    Abduction Limitations 2#    Hip Extension Both;2 sets;10 reps    Extension Limitations 2#    Stairs 7 inch 4RT single rail alternaitng pattern    Other Standing Knee Exercises step taps 2# weight x20 each 6 inch box    Other Standing Knee Exercises power ups on 6 inch box x 10 each with 2# weight      Knee/Hip Exercises: Seated   Long Arc Quad Both;1 set;15 reps;Weights    Long Arc Quad Weight 2 lbs.                 Balance Exercises - 03/31/21 0001       Balance Exercises: Standing   Tandem Gait 3 reps    Retro Gait 3 reps    Sidestepping 3 reps    Step Over Hurdles / Cones 3 RT 6 inch hurlde FWD  PT Short Term Goals - 03/10/21 1355       PT SHORT TERM GOAL #1   Title Patient will be independent with HEP in order to improve functional outcomes.    Time 2    Period Weeks    Status New    Target Date 03/24/21      PT SHORT TERM GOAL #2   Title Patient will report at least 25% improvement in symptoms for improved quality of life.    Time 2    Period Weeks    Status New    Target Date 03/24/21               PT Long Term Goals - 03/10/21 1355       PT LONG TERM GOAL #1   Title Patient will report at least 75% improvement in symptoms for improved quality of life.    Time 4    Period Weeks    Status New    Target Date 04/07/21      PT LONG TERM GOAL #2   Title Patient will be able to ambulate at least 275 feet in 2MWT in order to demonstrate improved gait speed for community ambulation.    Time 4    Period Weeks    Status New    Target Date 04/07/21      PT LONG TERM GOAL #3   Title Patient will score at least 15/24 on DGI to indicate improved balance to reduce the risk for falls.    Time 4    Period Weeks    Status New    Target Date 04/07/21                   Plan - 03/31/21 1433     Clinical Impression Statement Patient tolerated session  well today. Progressed dynamic balance activity and functional LE strengthening. Added weighted power ups, patient did well but required verbal cues for proper form and hold times. Patient showing improved dynamic balance and able to ambulate stairs reciprocally with single hand rail, and no hand rail intermittently. Improved stability during turns with dynamic balance activity as well. Will continue to progress as tolerated. Reassess next visit.    Personal Factors and Comorbidities Age;Fitness;Past/Current Experience;Comorbidity 2;Time since onset of injury/illness/exacerbation    Examination-Activity Limitations Locomotion Level;Transfers;Squat;Stairs;Stand;Bend    Examination-Participation Restrictions Cleaning;Community Activity;Shop;Volunteer;Yard Work    [employer]    PT Frequency 2x / week    PT Duration 4 weeks    PT Treatment/Interventions ADLs/Self Care Home Management;Aquatic Therapy;Canalith Repostioning;Cryotherapy;Electrical Stimulation;Iontophoresis '4mg'$ /ml Dexamethasone;Moist Heat;Traction;Ultrasound;DME Instruction;Gait training;Stair training;Functional mobility training;Therapeutic activities;Therapeutic exercise;Balance training;Neuromuscular re-education;Patient/family education;Orthotic Fit/Training;Manual techniques;Manual lymph drainage;Compression bandaging;Scar mobilization;Passive range of motion;Dry needling;Energy conservation;Splinting;Taping;Vasopneumatic Device    PT Next Visit Plan Progress dynamaic balance as tolerated. Reassess next visit    PT Home Exercise Plan walking, using SPC; 7/12: heel raise, bridge, SLR, sidelying clam; lumbar flexion/SB seated    Consulted and Agree with Plan of Care Patient;Family member/caregiver    Family Member Consulted daughter             Patient will benefit from skilled therapeutic intervention in order to improve the following deficits and impairments:  Abnormal gait, Difficulty walking, Decreased endurance,  Decreased activity tolerance, Decreased balance, Improper body mechanics, Decreased mobility, Decreased strength  Visit Diagnosis: Other abnormalities of gait and mobility  Muscle weakness (generalized)  Other symptoms and signs involving the musculoskeletal system     Problem List Patient Active Problem List  Diagnosis Date Noted   Multinodular goiter 06/15/2015   2:34 PM, 03/31/21 Josue Hector PT DPT  Physical Therapist with [facility]  [phone] [place] [address] [place], [place], [address] Phone: [phone]   Fax:  [phone]  Name: Meghan Oconnell MRN: [id number] Date of Birth: [birth year]

## 2021-04-02 ENCOUNTER — Ambulatory Visit (HOSPITAL_COMMUNITY): Payer: Medicare PPO

## 2021-04-02 ENCOUNTER — Other Ambulatory Visit: Payer: Self-pay

## 2021-04-02 ENCOUNTER — Encounter (HOSPITAL_COMMUNITY): Payer: Self-pay

## 2021-04-02 DIAGNOSIS — R2689 Other abnormalities of gait and mobility: Secondary | ICD-10-CM

## 2021-04-02 DIAGNOSIS — R29898 Other symptoms and signs involving the musculoskeletal system: Secondary | ICD-10-CM

## 2021-04-02 DIAGNOSIS — M6281 Muscle weakness (generalized): Secondary | ICD-10-CM

## 2021-04-02 NOTE — Therapy (Signed)
[place] [address] [place], [place], [address] Phone: [phone]   Fax:  [phone]  Physical Therapy Treatment and D/C Summary  Patient Details  Name: Meghan Oconnell MRN: [id number] Date of Birth: July 30, 1931 Referring Provider (PT): Hildred Alamin PA-C  PHYSICAL THERAPY DISCHARGE SUMMARY  Visits from Start of Care: 8  Current functional level related to goals / functional outcomes: Demonstrates improved gait and balance abilities as evidenced by Dynamic Gait Index and improved safety/sequence with cane and right AFO   Remaining deficits: Some dynamic balance deficits   Education / Equipment: HEP   Patient agrees to discharge. Patient goals were partially met. Patient is being discharged due to being pleased with the current functional level.  Encounter Date: 04/02/2021   PT End of Session - 04/02/21 1031     Visit Number 8    Number of Visits 8    Date for PT Re-Evaluation 04/07/21    Authorization Type [employer] 8 visits approved    Authorization Time Period 7/6-->04/07/21    Authorization - Visit Number 8    Authorization - Number of Visits 8    Progress Note Due on Visit 8    PT Start Time 1030    PT Stop Time 1100   D/C assessment and visit   PT Time Calculation (min) 30 min    Equipment Utilized During Treatment Gait belt    Activity Tolerance Patient tolerated treatment well;Patient limited by fatigue    Behavior During Therapy WFL for tasks assessed/performed             Past Medical History:  Diagnosis Date   Breast cancer ([place])     Past Surgical History:  Procedure Laterality Date   BREAST LUMPECTOMY     CHOLECYSTECTOMY     HERNIA REPAIR     OTHER SURGICAL HISTORY     Colon Surgery    There were no vitals filed for this visit.   Subjective Assessment - 04/02/21 1035     Subjective Back is no more sore than usual. Denies any instances of LOB    Patient Stated Goals get her foot  straighter and get rid of drop foot    Currently in Pain? Yes    Pain Score 3     Pain Location Back    Pain Orientation Posterior;Lower    Pain Descriptors / Indicators Aching;Sore    Pain Type Chronic pain    Pain Onset More than a month ago                [facility] PT Assessment - 04/02/21 0001       Assessment   Medical Diagnosis Closed displaced fracture of R femur    Referring Provider (PT) Hildred Alamin PA-C    Onset Date/Surgical Date 06/16/20      Ambulation/Gait   Ambulation/Gait Yes    Ambulation/Gait Assistance 6: Modified independent (Device/Increase time)    Ambulation Distance (Feet) 226 Feet    Assistive device Straight cane    Gait Pattern Step-through pattern    Ambulation Surface Level;Indoor    Stairs Yes    Stairs Assistance 6: Modified independent (Device/Increase time)    Stair Management Technique Alternating pattern;One rail Right;With cane    Height of Stairs 6      Dynamic Gait Index   Level Surface Normal    Change in Gait Speed Mild Impairment    Gait with Horizontal Head Turns Mild Impairment    Gait  with Vertical Head Turns Mild Impairment    Gait and Pivot Turn Mild Impairment    Step Over Obstacle Mild Impairment    Step Around Obstacles Mild Impairment    Steps Mild Impairment    Total Score 17                           [facility] Adult PT Treatment/Exercise - 04/02/21 0001       Knee/Hip Exercises: Standing   Heel Raises 2 sets;10 reps    Hip Flexion Both;20 reps    Hip Abduction Both;2 sets;10 reps    Hip Extension Both;2 sets;10 reps                      PT Short Term Goals - 04/02/21 1049       PT SHORT TERM GOAL #1   Title Patient will be independent with HEP in order to improve functional outcomes.    Time 2    Period Weeks    Status Achieved    Target Date 03/24/21      PT SHORT TERM GOAL #2   Title Patient will report at least 25% improvement in symptoms for improved quality of life.     Baseline 25% improvement    Time 2    Period Weeks    Status Achieved    Target Date 03/24/21               PT Long Term Goals - 04/02/21 1049       PT LONG TERM GOAL #1   Title Patient will report at least 75% improvement in symptoms for improved quality of life.    Baseline 25% improvement    Time 4    Period Weeks    Status Not Met      PT LONG TERM GOAL #2   Title Patient will be able to ambulate at least 275 feet in 2MWT in order to demonstrate improved gait speed for community ambulation.    Baseline 226 ft with cane and right AFO    Time 4    Period Weeks    Status Not Met      PT LONG TERM GOAL #3   Title Patient will score at least 15/24 on DGI to indicate improved balance to reduce the risk for falls.    Baseline 17/24 DGI    Time 4    Period Weeks    Status Achieved                   Plan - 04/02/21 1058     Clinical Impression Statement Pt has progressed well with POC details. No motor return to right ankle dorsiflexion but demo improved gait and balance abilities as evidenced by DGI score. Demo independent with HEP and demo improved safety awareness and sequence with use of cane when ambulating.    Personal Factors and Comorbidities Age;Fitness;Past/Current Experience;Comorbidity 2;Time since onset of injury/illness/exacerbation    Examination-Activity Limitations Locomotion Level;Transfers;Squat;Stairs;Stand;Bend    Examination-Participation Restrictions Cleaning;Community Activity;Shop;Volunteer;Yard Work    [employer]    PT Frequency 2x / week    PT Duration 4 weeks    PT Treatment/Interventions ADLs/Self Care Home Management;Aquatic Therapy;Canalith Repostioning;Cryotherapy;Electrical Stimulation;Iontophoresis 94m/ml Dexamethasone;Moist Heat;Traction;Ultrasound;DME Instruction;Gait training;Stair training;Functional mobility training;Therapeutic activities;Therapeutic exercise;Balance training;Neuromuscular  re-education;Patient/family education;Orthotic Fit/Training;Manual techniques;Manual lymph drainage;Compression bandaging;Scar mobilization;Passive range of motion;Dry needling;Energy conservation;Splinting;Taping;Vasopneumatic Device    PT Next Visit Plan D/C to HEP  PT Home Exercise Plan walking, using SPC; 7/12: heel raise, bridge, SLR, sidelying clam; lumbar flexion/SB seated    Consulted and Agree with Plan of Care Patient;Family member/caregiver    Family Member Consulted daughter             Patient will benefit from skilled therapeutic intervention in order to improve the following deficits and impairments:  Abnormal gait, Difficulty walking, Decreased endurance, Decreased activity tolerance, Decreased balance, Improper body mechanics, Decreased mobility, Decreased strength  Visit Diagnosis: Other abnormalities of gait and mobility  Muscle weakness (generalized)  Other symptoms and signs involving the musculoskeletal system     Problem List Patient Active Problem List   Diagnosis Date Noted   Multinodular goiter 06/15/2015   11:03 AM, 04/02/21 M. Sherlyn Lees, PT, DPT Physical Therapist- [place] Office Number: [phone]   [place] [address] [place], [place], [address] Phone: [phone]   Fax:  [phone]  Name: Meghan Oconnell MRN: [id number] Date of Birth: 20-May-1931

## 2022-05-03 ENCOUNTER — Encounter: Payer: Self-pay | Admitting: Radiology

## 2022-07-20 ENCOUNTER — Encounter (HOSPITAL_COMMUNITY): Payer: Self-pay | Admitting: *Deleted

## 2022-07-20 ENCOUNTER — Emergency Department (HOSPITAL_COMMUNITY)
Admission: EM | Admit: 2022-07-20 | Discharge: 2022-07-20 | Disposition: A | Discharge status: Home / Self Care | Payer: Medicare PPO | Attending: Emergency Medicine | Admitting: Emergency Medicine

## 2022-07-20 ENCOUNTER — Other Ambulatory Visit: Payer: Self-pay

## 2022-07-20 DIAGNOSIS — R531 Weakness: Secondary | ICD-10-CM | POA: Diagnosis not present

## 2022-07-20 DIAGNOSIS — N390 Urinary tract infection, site not specified: Secondary | ICD-10-CM | POA: Diagnosis not present

## 2022-07-20 DIAGNOSIS — R110 Nausea: Secondary | ICD-10-CM | POA: Insufficient documentation

## 2022-07-20 DIAGNOSIS — R109 Unspecified abdominal pain: Secondary | ICD-10-CM | POA: Diagnosis present

## 2022-07-20 DIAGNOSIS — N3001 Acute cystitis with hematuria: Secondary | ICD-10-CM | POA: Diagnosis not present

## 2022-07-20 LAB — COMPREHENSIVE METABOLIC PANEL
ALT: 12 U/L (ref 0–44)
AST: 19 U/L (ref 15–41)
Albumin: 3.7 g/dL (ref 3.5–5.0)
Alkaline Phosphatase: 69 U/L (ref 38–126)
Anion gap: 8 (ref 5–15)
BUN: 16 mg/dL (ref 8–23)
CO2: 26 mmol/L (ref 22–32)
Calcium: 8.9 mg/dL (ref 8.9–10.3)
Chloride: 105 mmol/L (ref 98–111)
Creatinine, Ser: 1.07 mg/dL — ABNORMAL HIGH (ref 0.44–1.00)
GFR, Estimated: 50 mL/min — ABNORMAL LOW (ref >60)
Glucose, Bld: 116 mg/dL — ABNORMAL HIGH (ref 70–99)
Potassium: 4.5 mmol/L (ref 3.5–5.1)
Sodium: 139 mmol/L (ref 135–145)
Total Bilirubin: 0.7 mg/dL (ref 0.3–1.2)
Total Protein: 7 g/dL (ref 6.5–8.1)

## 2022-07-20 LAB — CBC WITH DIFFERENTIAL/PLATELET
Abs Immature Granulocytes: 0.05 10*3/uL (ref 0.00–0.07)
Basophils Absolute: 0 10*3/uL (ref 0.0–0.1)
Basophils Relative: 1 %
Eosinophils Absolute: 0.2 10*3/uL (ref 0.0–0.5)
Eosinophils Relative: 2 %
HCT: 42.5 % (ref 36.0–46.0)
Hemoglobin: 13.6 g/dL (ref 12.0–15.0)
Immature Granulocytes: 1 %
Lymphocytes Relative: 13 %
Lymphs Abs: 0.9 10*3/uL (ref 0.7–4.0)
MCH: 30 pg (ref 26.0–34.0)
MCHC: 32 g/dL (ref 30.0–36.0)
MCV: 93.6 fL (ref 80.0–100.0)
Monocytes Absolute: 0.7 10*3/uL (ref 0.1–1.0)
Monocytes Relative: 10 %
Neutro Abs: 5.4 10*3/uL (ref 1.7–7.7)
Neutrophils Relative %: 73 %
Platelets: 200 10*3/uL (ref 150–400)
RBC: 4.54 MIL/uL (ref 3.87–5.11)
RDW: 13.1 % (ref 11.5–15.5)
WBC: 7.3 10*3/uL (ref 4.0–10.5)
nRBC: 0 % (ref 0.0–0.2)

## 2022-07-20 LAB — URINALYSIS, ROUTINE W REFLEX MICROSCOPIC
Bilirubin Urine: NEGATIVE
Glucose, UA: NEGATIVE mg/dL
Hgb urine dipstick: NEGATIVE
Ketones, ur: NEGATIVE mg/dL
Leukocytes,Ua: NEGATIVE
Nitrite: NEGATIVE
Protein, ur: NEGATIVE mg/dL
Specific Gravity, Urine: 1.009 (ref 1.005–1.030)
pH: 7 (ref 5.0–8.0)

## 2022-07-20 MED ORDER — ONDANSETRON 4 MG PO TBDP: ORAL_TABLET | ORAL | 0 refills | Status: DC

## 2022-07-20 MED ORDER — SODIUM CHLORIDE 0.9 % IV BOLUS
1000.0000 mL | Freq: Once | INTRAVENOUS | Status: AC
  Administered 2022-07-20: 1000 mL via INTRAVENOUS

## 2022-07-20 MED ORDER — SODIUM CHLORIDE 0.9 % IV SOLN
2.0000 g | Freq: Once | INTRAVENOUS | Status: AC
  Administered 2022-07-20: 2 g via INTRAVENOUS
  Filled 2022-07-20: qty 20

## 2022-07-20 NOTE — ED Triage Notes (Signed)
Pt states she went to urgent care x one week ago and was diagnosed with a UTI; pt was given antibiotics but urgent care called Monday and told pt her culture showed that she needed to be on a different antibiotic  Pt states she no longer has the burning with urination but still has urinary frequency

## 2022-07-20 NOTE — Discharge Instructions (Signed)
Plenty of fluids and follow-up with your doctor tomorrow as planned

## 2022-07-20 NOTE — ED Provider Notes (Signed)
[facility] Provider Note   CSN: [id number] Arrival date & time: 07/20/22  0867     History  Chief Complaint  Patient presents with   Abdominal Pain    Meghan Oconnell is a 88 y.o. female.  Patient complains of weakness.  She is being treated for urinary tract infection.  She also has had some nausea  The history is provided by the patient and medical records. No language interpreter was used.  Abdominal Pain Pain location:  Generalized Pain quality: aching   Pain radiates to:  Does not radiate Pain severity:  Mild Onset quality:  Sudden Timing:  Intermittent Progression:  Waxing and waning Associated symptoms: dysuria   Associated symptoms: no chest pain, no cough, no diarrhea, no fatigue and no hematuria        Home Medications Prior to Admission medications   Medication Sig Start Date End Date Taking? Authorizing Provider  atorvastatin (LIPITOR) 20 MG tablet  10/02/19  Yes [provider]  mirtazapine (REMERON) 15 MG tablet Take 15 mg by mouth at bedtime.   Yes [provider]  ondansetron (ZOFRAN-ODT) 4 MG disintegrating tablet '4mg'$  ODT q4 hours prn nausea/vomit 07/20/22  Yes Milton Ferguson, MD  sulfamethoxazole-trimethoprim (BACTRIM DS) 800-160 MG tablet Take 1 tablet by mouth 2 (two) times daily. 07/18/22  Yes [provider]  predniSONE (DELTASONE) 20 MG tablet Take 1 tablet (20 mg total) by mouth daily. Patient not taking: Reported on 07/20/2022 09/09/19   Virgel Manifold, MD  traMADol (ULTRAM) 50 MG tablet Take 1 tablet (50 mg total) by mouth every 8 (eight) hours as needed for moderate pain or severe pain. Patient not taking: Reported on 07/20/2022 09/09/19   Virgel Manifold, MD      Allergies    Patient has no known allergies.    Review of Systems   Review of Systems  Constitutional:  Negative for appetite change and fatigue.  HENT:  Negative for congestion, ear discharge and sinus pressure.   Eyes:  Negative  for discharge.  Respiratory:  Negative for cough.   Cardiovascular:  Negative for chest pain.  Gastrointestinal:  Positive for abdominal pain. Negative for diarrhea.  Genitourinary:  Positive for dysuria. Negative for frequency and hematuria.  Musculoskeletal:  Negative for back pain.  Skin:  Negative for rash.  Neurological:  Negative for seizures and headaches.  Psychiatric/Behavioral:  Negative for hallucinations.     Physical Exam Updated Vital Signs BP 134/85   Pulse 80   Temp 97.7 F (36.5 C) (Oral)   Resp 18   Ht 5' 4.5" (1.638 m)   Wt 54.9 kg   SpO2 96%   BMI 20.45 kg/m  Physical Exam Vitals and nursing note reviewed.  Constitutional:      Appearance: She is well-developed.  HENT:     Head: Normocephalic.     Mouth/Throat:     Mouth: Mucous membranes are moist.  Eyes:     General: No scleral icterus.    Conjunctiva/sclera: Conjunctivae normal.  Neck:     Thyroid: No thyromegaly.  Cardiovascular:     Rate and Rhythm: Normal rate and regular rhythm.     Heart sounds: No murmur heard.    No friction rub. No gallop.  Pulmonary:     Breath sounds: No stridor. No wheezing or rales.  Chest:     Chest wall: No tenderness.  Abdominal:     General: There is no distension.     Tenderness: There is abdominal tenderness.  There is no rebound.  Musculoskeletal:        General: Normal range of motion.     Cervical back: Neck supple.  Lymphadenopathy:     Cervical: No cervical adenopathy.  Skin:    Findings: No erythema or rash.  Neurological:     Mental Status: She is oriented to person, place, and time.     Motor: No abnormal muscle tone.     Coordination: Coordination normal.  Psychiatric:        Behavior: Behavior normal.     ED Results / Procedures / Treatments   Labs (all labs ordered are listed, but only abnormal results are displayed) Labs Reviewed  COMPREHENSIVE METABOLIC PANEL - Abnormal; Notable for the following components:      Result Value    Glucose, Bld 116 (*)    Creatinine, Ser 1.07 (*)    GFR, Estimated 50 (*)    All other components within normal limits  URINE CULTURE  CBC WITH DIFFERENTIAL/PLATELET  URINALYSIS, ROUTINE W REFLEX MICROSCOPIC    EKG None  Radiology No results found.  Procedures Procedures    Medications Ordered in ED Medications  sodium chloride 0.9 % bolus 1,000 mL (0 mLs Intravenous Stopped 07/20/22 1202)  cefTRIAXone (ROCEPHIN) 2 g in sodium chloride 0.9 % 100 mL IVPB (0 g Intravenous Stopped 07/20/22 1203)    ED Course/ Medical Decision Making/ A&P                           Medical Decision Making Amount and/or Complexity of Data Reviewed Labs: ordered.  Risk Prescription drug management.  This patient presents to the ED for concern of dysuria, this involves an extensive number of treatment options, and is a complaint that carries with it a high risk of complications and morbidity.  The differential diagnosis includes UTI   Co morbidities that complicate the patient evaluation  None   Additional history obtained:  Additional history obtained from patient External records from outside source obtained and reviewed including hospital records   Lab Tests:  I Ordered, and personally interpreted labs.  The pertinent results include: CBC shows white blood count 7.3   Imaging Studies ordered: No imaging Cardiac Monitoring: / EKG:  The patient was maintained on a cardiac monitor.  I personally viewed and interpreted the cardiac monitored which showed an underlying rhythm of: Normal sinus rhythm   Consultations Obtained:  No consultant  Problem List / ED Course / Critical interventions / Medication management  UTI dehydration I ordered medication including normal saline Reevaluation of the patient after these medicines showed that the patient stayed the same I have reviewed the patients home medicines and have made adjustments as needed   Social Determinants of  Health:  None   Test / Admission - Considered:  None  Patient with mild dehydration and UTI.  She is given fluids and Rocephin and will follow-up with her doctor tomorrow        Final Clinical Impression(s) / ED Diagnoses Final diagnoses:  Acute cystitis with hematuria    Rx / DC Orders ED Discharge Orders          Ordered    ondansetron (ZOFRAN-ODT) 4 MG disintegrating tablet        07/20/22 1337              Milton Ferguson, MD 07/22/22 1843

## 2022-07-21 ENCOUNTER — Ambulatory Visit (INDEPENDENT_AMBULATORY_CARE_PROVIDER_SITE_OTHER): Payer: Medicare PPO | Admitting: Internal Medicine

## 2022-07-21 ENCOUNTER — Encounter: Payer: Self-pay | Admitting: Internal Medicine

## 2022-07-21 VITALS — BP 110/68 | HR 80 | Ht 61.5 in | Wt 122.6 lb

## 2022-07-21 DIAGNOSIS — Z0001 Encounter for general adult medical examination with abnormal findings: Secondary | ICD-10-CM

## 2022-07-21 DIAGNOSIS — N3000 Acute cystitis without hematuria: Secondary | ICD-10-CM | POA: Diagnosis not present

## 2022-07-21 DIAGNOSIS — E042 Nontoxic multinodular goiter: Secondary | ICD-10-CM | POA: Diagnosis not present

## 2022-07-21 DIAGNOSIS — E785 Hyperlipidemia, unspecified: Secondary | ICD-10-CM | POA: Diagnosis not present

## 2022-07-21 DIAGNOSIS — Z853 Personal history of malignant neoplasm of breast: Secondary | ICD-10-CM

## 2022-07-21 DIAGNOSIS — G4700 Insomnia, unspecified: Secondary | ICD-10-CM

## 2022-07-21 LAB — URINE CULTURE: Culture: NO GROWTH

## 2022-07-21 NOTE — Patient Instructions (Signed)
It was a pleasure to see you today.  Thank you for giving [place] the opportunity to be involved in your care.  Below is a brief recap of your visit and next steps.  We will plan to see you again in 3 months.  Summary You have established care today We will check labs and plan for follow up in 3 months.

## 2022-07-21 NOTE — Progress Notes (Signed)
New Patient Office Visit  Subjective    Patient ID: Meghan Oconnell, female    DOB: 01/22/1931  Age: 88 y.o. MRN: [id number]  CC:  Chief Complaint  Patient presents with   Establish Care    HPI Meghan Oconnell presents to establish care.  She is an 88 year old woman who reports a past medical history of breast cancer s/p lumpectomy and radiation, multinodular goiter, CAD, HLD, and right hip THA in 2022.  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  Meghan Oconnell was previously followed at the [facility] clinic.  She is accompanied by her daughters today.  Meghan Oconnell's acute concerns or frequent UTIs.  She reports that she is currently taking Bactrim for UTI and presented to the emergency department yesterday (11/15) for persistent symptoms of dysuria.  She was treated with IV fluids and Rocephin.  She additionally endorses insomnia today.  Acute concerns, chronic medical conditions, and outstanding preventative care items discussed today are individually addressed in A/P below.  Outpatient Encounter Medications as of 07/21/2022  Medication Sig   atorvastatin (LIPITOR) 20 MG tablet    Cranberry-Vitamin C 250-60 MG CAPS Take 1 capsule by mouth in the morning and at bedtime.   mirtazapine (REMERON) 15 MG tablet Take 15 mg by mouth at bedtime.   ondansetron (ZOFRAN-ODT) 4 MG disintegrating tablet '4mg'$  ODT q4 hours prn nausea/vomit   Probiotic Product (CULTURELLE PROBIOTICS PO) Take 1 capsule by mouth daily.   sulfamethoxazole-trimethoprim (BACTRIM DS) 800-160 MG tablet Take 1 tablet by mouth 2 (two) times daily.   [DISCONTINUED] predniSONE (DELTASONE) 20 MG tablet Take 1 tablet (20 mg total) by mouth daily. (Patient not taking: Reported on 07/20/2022)   [DISCONTINUED] traMADol (ULTRAM) 50 MG tablet Take 1 tablet (50 mg total) by mouth every 8 (eight) hours as needed for moderate pain or severe pain. (Patient not taking: Reported on 07/20/2022)   No  facility-administered encounter medications on file as of 07/21/2022.    Past Medical History:  Diagnosis Date   Breast cancer [facility])     Past Surgical History:  Procedure Laterality Date   BREAST LUMPECTOMY     CHOLECYSTECTOMY     HERNIA REPAIR     OTHER SURGICAL HISTORY     Colon Surgery    Family History  Problem Relation Age of Onset   Cancer Sister     Social History   Socioeconomic History   Marital status: Widowed    Spouse name: Not on file   Number of children: Not on file   Years of education: Not on file   Highest education level: Not on file  Occupational History   Not on file  Tobacco Use   Smoking status: Never   Smokeless tobacco: Never  Vaping Use   Vaping Use: Never used  Substance and Sexual Activity   Alcohol use: No    Alcohol/week: 0.0 standard drinks of alcohol   Drug use: No   Sexual activity: Not on file  Other Topics Concern   Not on file  Social History Narrative   Not on file   Social Determinants of Health   Financial Resource Strain: Not on file  Food Insecurity: Not on file  Transportation Needs: Not on file  Physical Activity: Not on file  Stress: Not on file  Social Connections: Not on file  Intimate Partner Violence: Not on file   Review of Systems  Genitourinary:  Positive for dysuria and frequency.  Psychiatric/Behavioral:  The patient  has insomnia.   All other systems reviewed and are negative.  Objective    BP 110/68   Pulse 80   Ht 5' 1.5" (1.562 m)   Wt 122 lb 9.6 oz (55.6 kg)   SpO2 94%   BMI 22.79 kg/m   Physical Exam Vitals reviewed.  Constitutional:      General: She is not in acute distress.    Appearance: Normal appearance. She is not toxic-appearing.  HENT:     Head: Normocephalic and atraumatic.     Right Ear: External ear normal.     Left Ear: External ear normal.     Nose: Nose normal. No congestion or rhinorrhea.     Mouth/Throat:     Mouth: Mucous membranes are moist.     Pharynx:  Oropharynx is clear. No oropharyngeal exudate or posterior oropharyngeal erythema.  Eyes:     General: No scleral icterus.    Extraocular Movements: Extraocular movements intact.     Conjunctiva/sclera: Conjunctivae normal.     Pupils: Pupils are equal, round, and reactive to light.  Cardiovascular:     Rate and Rhythm: Normal rate and regular rhythm.     Pulses: Normal pulses.     Heart sounds: Normal heart sounds. No murmur heard.    No friction rub. No gallop.  Pulmonary:     Effort: Pulmonary effort is normal.     Breath sounds: Normal breath sounds. No wheezing, rhonchi or rales.  Abdominal:     General: Abdomen is flat. Bowel sounds are normal. There is no distension.     Palpations: Abdomen is soft.     Tenderness: There is no abdominal tenderness.  Musculoskeletal:        General: No swelling. Normal range of motion.     Cervical back: Normal range of motion.     Right lower leg: No edema.     Left lower leg: No edema.  Lymphadenopathy:     Cervical: No cervical adenopathy.  Skin:    General: Skin is warm and dry.     Capillary Refill: Capillary refill takes less than 2 seconds.     Coloration: Skin is not jaundiced.  Neurological:     General: No focal deficit present.     Mental Status: She is alert and oriented to person, place, and time.  Psychiatric:        Mood and Affect: Mood normal.        Behavior: Behavior normal.    Assessment & Plan:   Problem List Items Addressed This Visit       Multinodular goiter    She has a history of multinodular goiter and was previously followed by endocrinology.  She is asymptomatic currently.  Repeat TSH/T4 ordered today.      Urinary tract infection    She endorses a history of frequent UTIs and is currently being treated with Bactrim for UTI.  She presented to the ED yesterday due to persistent symptoms of dysuria and was treated with Rocephin and IV fluids.  I recommended that she complete treatment with Bactrim as  previously prescribed.      History of breast cancer    She reports a history of breast cancer s/p lumpectomy and radiation.  Previous records been requested today.      HLD (hyperlipidemia)    Currently prescribed atorvastatin 20 mg daily.  She reports recently having a lipid panel completed by her previous PCP.  Records requested today.  Insomnia    Her acute concern today is insomnia.  She endorses difficulty both falling and staying asleep.  I have reviewed appropriate sleep hygiene measures and recommended taking melatonin at least 2 hours before she plans to go to sleep.       Encounter for general adult medical examination with abnormal findings    Presenting today to establish care.  Records from the [facility] clinic have been requested today. -Labs from 11/15 were reviewed.  I have ordered additional labs today including vitamin D, TSH/T4, and vitamin B12/folate -She declined all outstanding vaccines today -Follow-up in 3 months      Return in about 3 months (around 10/21/2022).   Johnette Abraham, MD

## 2022-07-22 LAB — VITAMIN D 25 HYDROXY (VIT D DEFICIENCY, FRACTURES): Vit D, 25-Hydroxy: 28.6 ng/mL — ABNORMAL LOW (ref 30.0–100.0)

## 2022-07-22 LAB — TSH+FREE T4
Free T4: 1.21 ng/dL (ref 0.82–1.77)
TSH: 0.262 u[IU]/mL — ABNORMAL LOW (ref 0.450–4.500)

## 2022-07-22 LAB — B12 AND FOLATE PANEL
Folate: 5.3 ng/mL (ref >3.0)
Vitamin B-12: 342 pg/mL (ref 232–1245)

## 2022-07-27 DIAGNOSIS — E785 Hyperlipidemia, unspecified: Secondary | ICD-10-CM | POA: Insufficient documentation

## 2022-07-27 DIAGNOSIS — G4700 Insomnia, unspecified: Secondary | ICD-10-CM | POA: Insufficient documentation

## 2022-07-27 DIAGNOSIS — Z0001 Encounter for general adult medical examination with abnormal findings: Secondary | ICD-10-CM | POA: Insufficient documentation

## 2022-07-27 DIAGNOSIS — N390 Urinary tract infection, site not specified: Secondary | ICD-10-CM

## 2022-07-27 DIAGNOSIS — Z853 Personal history of malignant neoplasm of breast: Secondary | ICD-10-CM | POA: Insufficient documentation

## 2022-07-27 HISTORY — DX: Urinary tract infection, site not specified: N39.0

## 2022-07-27 NOTE — Assessment & Plan Note (Signed)
She reports a history of breast cancer s/p lumpectomy and radiation.  Previous records been requested today.

## 2022-07-27 NOTE — Assessment & Plan Note (Signed)
Her acute concern today is insomnia.  She endorses difficulty both falling and staying asleep.  I have reviewed appropriate sleep hygiene measures and recommended taking melatonin at least 2 hours before she plans to go to sleep.

## 2022-07-27 NOTE — Assessment & Plan Note (Signed)
She endorses a history of frequent UTIs and is currently being treated with Bactrim for UTI.  She presented to the ED yesterday due to persistent symptoms of dysuria and was treated with Rocephin and IV fluids.  I recommended that she complete treatment with Bactrim as previously prescribed.

## 2022-07-27 NOTE — Assessment & Plan Note (Addendum)
Presenting today to establish care.  Records from the [facility] clinic have been requested today. -Labs from 11/15 were reviewed.  I have ordered additional labs today including vitamin D, TSH/T4, and vitamin B12/folate -She declined all outstanding vaccines today -Follow-up in 3 months

## 2022-07-27 NOTE — Assessment & Plan Note (Signed)
Currently prescribed atorvastatin 20 mg daily.  She reports recently having a lipid panel completed by her previous PCP.  Records requested today.

## 2022-07-27 NOTE — Assessment & Plan Note (Signed)
She has a history of multinodular goiter and was previously followed by endocrinology.  She is asymptomatic currently.  Repeat TSH/T4 ordered today.

## 2022-08-01 ENCOUNTER — Ambulatory Visit (INDEPENDENT_AMBULATORY_CARE_PROVIDER_SITE_OTHER): Payer: Medicare PPO

## 2022-08-01 DIAGNOSIS — Z0000 Encounter for general adult medical examination without abnormal findings: Secondary | ICD-10-CM

## 2022-08-01 NOTE — Patient Instructions (Signed)
  Ms. Najera , Thank you for taking time to come for your Medicare Wellness Visit. I appreciate your ongoing commitment to your health goals. Please review the following plan we discussed and let me know if I can assist you in the future.   These are the goals we discussed:  Goals      Patient Stated     Patient doesn't currently have any goals        This is a list of the screening recommended for you and due dates:  Health Maintenance  Topic Date Due   Zoster (Shingles) Vaccine (1 of 2) Never done   DEXA scan (bone density measurement)  Never done   COVID-19 Vaccine (3 - Moderna risk series) 12/27/2019   Flu Shot  12/04/2022*   Pneumonia Vaccine (1 - PCV) 07/22/2023*   Medicare Annual Wellness Visit  08/02/2023   HPV Vaccine  Aged Out  *Topic was postponed. The date shown is not the original due date.

## 2022-08-01 NOTE — Progress Notes (Signed)
Subjective:   Payson Crumby is a 88 y.o. female who presents for Medicare Annual (Subsequent) preventive examination. I connected with  Fayrene Fearing on 08/01/22 by a audio enabled telemedicine application and verified that I am speaking with the correct person using two identifiers.  Patient Location: Home  Provider Location: Office/Clinic  I discussed the limitations of evaluation and management by telemedicine. The patient expressed understanding and agreed to proceed.  Review of Systems           Objective:    There were no vitals filed for this visit. There is no height or weight on file to calculate BMI.     07/20/2022   10:17 AM 03/10/2021    1:26 PM 10/16/2019    1:25 PM  Advanced Directives  Does Patient Have a Medical Advance Directive? No No No  Would patient like information on creating a medical advance directive?  No - Patient declined No - Patient declined    Current Medications (verified) Outpatient Encounter Medications as of 08/01/2022  Medication Sig   atorvastatin (LIPITOR) 20 MG tablet    Cranberry-Vitamin C 250-60 MG CAPS Take 1 capsule by mouth in the morning and at bedtime.   mirtazapine (REMERON) 15 MG tablet Take 15 mg by mouth at bedtime.   ondansetron (ZOFRAN-ODT) 4 MG disintegrating tablet '4mg'$  ODT q4 hours prn nausea/vomit   Probiotic Product (CULTURELLE PROBIOTICS PO) Take 1 capsule by mouth daily.   sulfamethoxazole-trimethoprim (BACTRIM DS) 800-160 MG tablet Take 1 tablet by mouth 2 (two) times daily.   No facility-administered encounter medications on file as of 08/01/2022.    Allergies (verified) Ivp dye [iodinated contrast media]   History: Past Medical History:  Diagnosis Date   Breast cancer ([place])    Past Surgical History:  Procedure Laterality Date   BREAST LUMPECTOMY     CHOLECYSTECTOMY     HERNIA REPAIR     OTHER SURGICAL HISTORY     Colon Surgery   Family History  Problem Relation Age of Onset   Cancer  Sister    Social History   Socioeconomic History   Marital status: Widowed    Spouse name: Not on file   Number of children: Not on file   Years of education: Not on file   Highest education level: Not on file  Occupational History   Not on file  Tobacco Use   Smoking status: Never   Smokeless tobacco: Never  Vaping Use   Vaping Use: Never used  Substance and Sexual Activity   Alcohol use: No    Alcohol/week: 0.0 standard drinks of alcohol   Drug use: No   Sexual activity: Not on file  Other Topics Concern   Not on file  Social History Narrative   Not on file   Social Determinants of Health   Financial Resource Strain: Not on file  Food Insecurity: Not on file  Transportation Needs: Not on file  Physical Activity: Not on file  Stress: Not on file  Social Connections: Not on file    Tobacco Counseling Counseling given: Not Answered   Clinical Intake:                 Diabetic?no         Activities of Daily Living     No data to display          Patient Care Team: Johnette Abraham, MD as PCP - General (Internal Medicine)  Indicate any recent Medical Services you may  have received from other than [facility] providers in the past year (date may be approximate).     Assessment:   This is a routine wellness examination for [facility].  Hearing/Vision screen No results found.  Dietary issues and exercise activities discussed:     Goals Addressed   None    Depression Screen    07/21/2022    2:52 PM 07/21/2022    2:51 PM 06/15/2016    2:04 PM 06/15/2015    2:31 PM  PHQ 2/9 Scores  PHQ - 2 Score 0 0 0 0    Fall Risk    07/21/2022    2:52 PM 06/15/2016    2:04 PM 06/15/2015    2:31 PM  Fall Risk   Falls in the past year? 1 No No  Number falls in past yr: 1    Injury with Fall? 0    Risk for fall due to : Impaired balance/gait;Impaired mobility    Follow up Falls evaluation completed      FALL RISK PREVENTION PERTAINING TO THE  HOME:  Any stairs in or around the home? Yes  If so, are there any without handrails? No  Home free of loose throw rugs in walkways, pet beds, electrical cords, etc? Yes  Adequate lighting in your home to reduce risk of falls? Yes   ASSISTIVE DEVICES UTILIZED TO PREVENT FALLS:  Life alert? Yes  Use of a cane, walker or w/c? No  Grab bars in the bathroom? Yes  Shower chair or bench in shower? Yes  Elevated toilet seat or a handicapped toilet? Yes   TIMED UP AND GO:  Was the test performed? No .  Length of time to ambulate 10 feet:  sec.     Cognitive Function:        Immunizations Immunization History  Administered Date(s) Administered   [employer] Vaccination 11/01/2019, 11/29/2019    TDAP status: Up to date  Flu Vaccine status: Due, Education has been provided regarding the importance of this vaccine. Advised may receive this vaccine at local pharmacy or Health Dept. Aware to provide a copy of the vaccination record if obtained from local pharmacy or Health Dept. Verbalized acceptance and understanding.  Pneumococcal vaccine status: Due, Education has been provided regarding the importance of this vaccine. Advised may receive this vaccine at local pharmacy or Health Dept. Aware to provide a copy of the vaccination record if obtained from local pharmacy or Health Dept. Verbalized acceptance and understanding.  Covid-19 vaccine status: Information provided on how to obtain vaccines.   Qualifies for Shingles Vaccine? Yes   Zostavax completed No   Shingrix Completed?: No.    Education has been provided regarding the importance of this vaccine. Patient has been advised to call insurance company to determine out of pocket expense if they have not yet received this vaccine. Advised may also receive vaccine at local pharmacy or Health Dept. Verbalized acceptance and understanding.  Screening Tests Health Maintenance  Topic Date Due   Medicare Annual Wellness (AWV)   Never done   Zoster Vaccines- Shingrix (1 of 2) Never done   DEXA SCAN  Never done   COVID-19 Vaccine (3 - Moderna risk series) 12/27/2019   INFLUENZA VACCINE  12/04/2022 (Originally 04/05/2022)   Pneumonia Vaccine 35+ Years old (1 - PCV) 07/22/2023 (Originally 01/20/1998)   HPV VACCINES  Aged Out    Health Maintenance  Health Maintenance Due  Topic Date Due   Medicare Annual Wellness (AWV)  Never done  Zoster Vaccines- Shingrix (1 of 2) Never done   DEXA SCAN  Never done   COVID-19 Vaccine (3 - Moderna risk series) 12/27/2019    Colorectal cancer screening: No longer required.   Mammogram status: No longer required due to age.    Lung Cancer Screening: (Low Dose CT Chest recommended if Age 62-80 years, 30 pack-year currently smoking OR have quit w/in 15years.) does not qualify.   Lung Cancer Screening Referral:   Additional Screening:  Hepatitis C Screening: does qualify; Completed   Vision Screening: Recommended annual ophthalmology exams for early detection of glaucoma and other disorders of the eye. Is the patient up to date with their annual eye exam?  No  Who is the provider or what is the name of the office in which the patient attends annual eye exams? N/a If pt is not established with a provider, would they like to be referred to a provider to establish care? No .   Dental Screening: Recommended annual dental exams for proper oral hygiene  Community Resource Referral / Chronic Care Management: CRR required this visit?  No   CCM required this visit?  No      Plan:     I have personally reviewed and noted the following in the patient's chart:   Medical and social history Use of alcohol, tobacco or illicit drugs  Current medications and supplements including opioid prescriptions. Patient is not currently taking opioid prescriptions. Functional ability and status Nutritional status Physical activity Advanced directives List of other  physicians Hospitalizations, surgeries, and ER visits in previous 12 months Vitals Screenings to include cognitive, depression, and falls Referrals and appointments  In addition, I have reviewed and discussed with patient certain preventive protocols, quality metrics, and best practice recommendations. A written personalized care plan for preventive services as well as general preventive health recommendations were provided to patient.     Jill Side, [place]   08/01/2022   Nurse Notes:

## 2022-08-24 ENCOUNTER — Telehealth: Payer: Self-pay | Admitting: Internal Medicine

## 2022-08-24 NOTE — Telephone Encounter (Signed)
Sent message to [employer].  Claim not paid for 07/21/22 because of the Z00.01 DX we think.  Please review and remover Z00.01 if you can and re-bill per Dr [name]. This is a new patient transferring care to Dr [name] as PCP.  The patient does have other DX per Dr [name].

## 2022-09-19 ENCOUNTER — Ambulatory Visit (INDEPENDENT_AMBULATORY_CARE_PROVIDER_SITE_OTHER): Payer: Medicare HMO | Admitting: Internal Medicine

## 2022-09-19 ENCOUNTER — Encounter: Payer: Self-pay | Admitting: Internal Medicine

## 2022-09-19 VITALS — BP 139/74 | HR 83 | Ht 64.5 in | Wt 124.6 lb

## 2022-09-19 DIAGNOSIS — N3001 Acute cystitis with hematuria: Secondary | ICD-10-CM | POA: Insufficient documentation

## 2022-09-19 DIAGNOSIS — G4700 Insomnia, unspecified: Secondary | ICD-10-CM

## 2022-09-19 DIAGNOSIS — R300 Dysuria: Secondary | ICD-10-CM

## 2022-09-19 HISTORY — DX: Acute cystitis with hematuria: N30.01

## 2022-09-19 LAB — POCT URINALYSIS DIP (CLINITEK)
Bilirubin, UA: NEGATIVE
Glucose, UA: 100 mg/dL — AB
Nitrite, UA: POSITIVE — AB
POC PROTEIN,UA: 100 — AB
Spec Grav, UA: 1.02 (ref 1.010–1.025)
Urobilinogen, UA: 1 E.U./dL
pH, UA: 5.5 (ref 5.0–8.0)

## 2022-09-19 MED ORDER — NITROFURANTOIN MONOHYD MACRO 100 MG PO CAPS: 100.0000 mg | ORAL_CAPSULE | Freq: Two times a day (BID) | ORAL | 0 refills | Status: AC

## 2022-09-19 NOTE — Progress Notes (Signed)
Acute Office Visit  Subjective:     Patient ID: Meghan Oconnell, female    DOB: Oct 14, 1930, 89 y.o.   MRN: [id number]  Chief Complaint  Patient presents with   Urinary Tract Infection    Took AZO 09/18/2022. Burning when urinating   Meghan Oconnell presents today for an acute visit for symptoms of dysuria.  She endorses onset of dysuria yesterday (1/14).  She denies fever/chills, increased urinary frequency, hematuria, suprapubic discomfort, flank pain, and fatigue.  She endorses a history of recurrent UTIs, last treated in November 2023 with Bactrim.  No previous culture data is on file.  Review of Systems  Constitutional:  Negative for chills, fever and malaise/fatigue.  Gastrointestinal:  Negative for abdominal pain, diarrhea, nausea and vomiting.  Genitourinary:  Positive for dysuria. Negative for flank pain, frequency, hematuria and urgency.      Objective:    BP 139/74   Pulse 83   Ht 5' 4.5" (1.638 m)   Wt 124 lb 9.6 oz (56.5 kg)   SpO2 94%   BMI 21.06 kg/m   Physical Exam Vitals reviewed.  Constitutional:      General: She is not in acute distress.    Appearance: Normal appearance. She is not toxic-appearing.  HENT:     Head: Normocephalic and atraumatic.  Cardiovascular:     Rate and Rhythm: Normal rate and regular rhythm.     Pulses: Normal pulses.     Heart sounds: Normal heart sounds.  Pulmonary:     Effort: Pulmonary effort is normal.     Breath sounds: Normal breath sounds.  Abdominal:     General: Abdomen is flat. Bowel sounds are normal. There is no distension.     Palpations: Abdomen is soft.     Tenderness: There is no abdominal tenderness. There is no right CVA tenderness, left CVA tenderness, guarding or rebound.  Skin:    General: Skin is warm and dry.     Coloration: Skin is not jaundiced.  Neurological:     General: No focal deficit present.     Mental Status: She is alert and oriented to person, place, and time.  Psychiatric:        Mood  and Affect: Mood normal.        Behavior: Behavior normal.    Results for orders placed or performed in visit on 09/19/22  POCT URINALYSIS DIP (CLINITEK)  Result Value Ref Range   Color, UA orange (A) yellow   Clarity, UA cloudy (A) clear   Glucose, UA =100 (A) negative mg/dL   Bilirubin, UA negative negative   Ketones, POC UA trace (5) (A) negative mg/dL   Spec Grav, UA 1.020 1.010 - 1.025   Blood, UA moderate (A) negative   pH, UA 5.5 5.0 - 8.0   POC PROTEIN,UA =100 (A) negative, trace   Urobilinogen, UA 1.0 0.2 or 1.0 E.U./dL   Nitrite, UA Positive (A) Negative   Leukocytes, UA Large (3+) (A) Negative      Assessment & Plan:   Problem List Items Addressed This Visit       Acute cystitis with hematuria    Presenting today for a 1 day history of dysuria.  Her past medical history is significant for recurrent UTIs.  UA obtained today is consistent with UTI.  No previous culture data is on file. -I have prescribed Macrobid 100 mg twice daily x 5 days for treatment of UTI -We will follow culture data  Insomnia    She continues to endorse insomnia today, noting difficulty with falling and staying asleep.  I have previously recommended melatonin but she states that this was ineffective.  We reviewed her night routine today and there is significant room for improvement from a sleep hygiene standpoint.  We reviewed appropriate sleep hygiene measures again today.  She has previously scheduled follow-up with me in 1 month (2/16).  If there is no improvement in her sleep at that time, consider additional medication options.      Meds ordered this encounter  Medications   nitrofurantoin, macrocrystal-monohydrate, (MACROBID) 100 MG capsule    Sig: Take 1 capsule (100 mg total) by mouth 2 (two) times daily for 5 days.    Dispense:  10 capsule    Refill:  0    Return if symptoms worsen or fail to improve.  Johnette Abraham, MD

## 2022-09-19 NOTE — Assessment & Plan Note (Signed)
She continues to endorse insomnia today, noting difficulty with falling and staying asleep.  I have previously recommended melatonin but she states that this was ineffective.  We reviewed her night routine today and there is significant room for improvement from a sleep hygiene standpoint.  We reviewed appropriate sleep hygiene measures again today.  She has previously scheduled follow-up with me in 1 month (2/16).  If there is no improvement in her sleep at that time, consider additional medication options.

## 2022-09-19 NOTE — Assessment & Plan Note (Signed)
Presenting today for a 1 day history of dysuria.  Her past medical history is significant for recurrent UTIs.  UA obtained today is consistent with UTI.  No previous culture data is on file. -I have prescribed Macrobid 100 mg twice daily x 5 days for treatment of UTI -We will follow culture data

## 2022-09-19 NOTE — Patient Instructions (Signed)
It was a pleasure to see you today.  Thank you for giving [place] the opportunity to be involved in your care.  Below is a brief recap of your visit and next steps.  We will plan to see you again in February.  Summary I have prescribed Macrobid x 5 days for treatment of UTI We will follow your urine culture data Plan for follow up on 10/21/22 Please let us know if your symptoms are not improving

## 2022-09-23 LAB — URINE CULTURE, REFLEX

## 2022-09-23 LAB — UA/M W/RFLX CULTURE, ROUTINE
Bilirubin, UA: NEGATIVE
Glucose, UA: NEGATIVE
Ketones, UA: NEGATIVE
Nitrite, UA: POSITIVE — AB
Specific Gravity, UA: 1.019 (ref 1.005–1.030)
Urobilinogen, Ur: 1 mg/dL (ref 0.2–1.0)
pH, UA: 5.5 (ref 5.0–7.5)

## 2022-09-23 LAB — MICROSCOPIC EXAMINATION
Casts: NONE SEEN /lpf
WBC, UA: >30 /hpf — AB (ref 0–5)

## 2022-10-21 ENCOUNTER — Other Ambulatory Visit: Payer: Self-pay | Admitting: Internal Medicine

## 2022-10-21 ENCOUNTER — Encounter: Payer: Self-pay | Admitting: Internal Medicine

## 2022-10-21 ENCOUNTER — Ambulatory Visit (INDEPENDENT_AMBULATORY_CARE_PROVIDER_SITE_OTHER): Payer: Medicare HMO | Admitting: Internal Medicine

## 2022-10-21 VITALS — BP 131/76 | HR 82 | Ht 64.5 in | Wt 123.8 lb

## 2022-10-21 DIAGNOSIS — G4700 Insomnia, unspecified: Secondary | ICD-10-CM | POA: Diagnosis not present

## 2022-10-21 MED ORDER — TRAZODONE HCL 50 MG PO TABS: 25.0000 mg | ORAL_TABLET | Freq: Every evening | ORAL | 3 refills | Status: DC | PRN

## 2022-10-21 MED ORDER — RAMELTEON 8 MG PO TABS: 8.0000 mg | ORAL_TABLET | Freq: Every day | ORAL | 2 refills | Status: DC

## 2022-10-21 NOTE — Patient Instructions (Signed)
It was a pleasure to see you today.  Thank you for giving [place] the opportunity to be involved in your care.  Below is a brief recap of your visit and next steps.  We will plan to see you again in 6 months.  Summary Try ramelteon for sleep. No additional medication changes. Follow up in 6 months

## 2022-10-21 NOTE — Progress Notes (Signed)
Established Patient Office Visit  Subjective   Patient ID: Meghan Oconnell, female    DOB: Oct 07, 1930  Age: 89 y.o. MRN: [id number]  Chief Complaint  Patient presents with   Insomnia    Follow up on previous things   Meghan Oconnell returns to care today.  She was last seen by me on 1/15 for an acute visit at which time she endorsed dysuria and insomnia.  She was treated for UTI and we discussed sleep hygiene measures.  She returns today for previously scheduled 1 month follow-up.  There have been no acute interval events.  Meghan Oconnell continues to endorse insomnia today.  She has tried to improve her sleep hygiene, but her insomnia has not improved.  She has difficulty falling asleep but does not have difficulty staying asleep.  She is otherwise asymptomatic and has no acute concerns to discuss today.  Past Medical History:  Diagnosis Date   Breast cancer ([place])    Past Surgical History:  Procedure Laterality Date   BREAST LUMPECTOMY     CHOLECYSTECTOMY     HERNIA REPAIR     OTHER SURGICAL HISTORY     Colon Surgery   Social History   Tobacco Use   Smoking status: Never   Smokeless tobacco: Never  Vaping Use   Vaping Use: Never used  Substance Use Topics   Alcohol use: No    Alcohol/week: 0.0 standard drinks of alcohol   Drug use: No   Family History  Problem Relation Age of Onset   Cancer Sister    Allergies  Allergen Reactions   Ivp Dye [Iodinated Contrast Media] Rash    Blue Dye   Review of Systems  Constitutional:  Negative for chills and fever.  HENT:  Negative for sore throat.   Respiratory:  Negative for cough and shortness of breath.   Cardiovascular:  Negative for chest pain, palpitations and leg swelling.  Gastrointestinal:  Negative for abdominal pain, blood in stool, constipation, diarrhea, nausea and vomiting.  Genitourinary:  Negative for dysuria and hematuria.  Musculoskeletal:  Negative for myalgias.  Skin:  Negative for itching and rash.   Neurological:  Negative for dizziness and headaches.  Psychiatric/Behavioral:  Negative for depression and suicidal ideas. The patient has insomnia.      Objective:     BP 131/76   Pulse 82   Ht 5' 4.5" (1.638 m)   Wt 123 lb 12.8 oz (56.2 kg)   SpO2 96%   BMI 20.92 kg/m  BP Readings from Last 3 Encounters:  10/21/22 131/76  09/19/22 139/74  07/21/22 110/68   Physical Exam Vitals reviewed.  Constitutional:      General: She is not in acute distress.    Appearance: Normal appearance. She is not toxic-appearing.  HENT:     Head: Normocephalic and atraumatic.     Right Ear: External ear normal.     Left Ear: External ear normal.     Nose: Nose normal. No congestion or rhinorrhea.     Mouth/Throat:     Mouth: Mucous membranes are moist.     Pharynx: Oropharynx is clear. No oropharyngeal exudate or posterior oropharyngeal erythema.  Eyes:     General: No scleral icterus.    Extraocular Movements: Extraocular movements intact.     Conjunctiva/sclera: Conjunctivae normal.     Pupils: Pupils are equal, round, and reactive to light.  Cardiovascular:     Rate and Rhythm: Normal rate and regular rhythm.     Pulses: Normal  pulses.     Heart sounds: Normal heart sounds. No murmur heard.    No friction rub. No gallop.  Pulmonary:     Effort: Pulmonary effort is normal.     Breath sounds: Normal breath sounds. No wheezing, rhonchi or rales.  Abdominal:     General: Abdomen is flat. Bowel sounds are normal. There is no distension.     Palpations: Abdomen is soft.     Tenderness: There is no abdominal tenderness. There is no right CVA tenderness, left CVA tenderness, guarding or rebound.  Musculoskeletal:        General: No swelling. Normal range of motion.     Cervical back: Normal range of motion.     Right lower leg: No edema.     Left lower leg: No edema.  Lymphadenopathy:     Cervical: No cervical adenopathy.  Skin:    General: Skin is warm and dry.     Capillary  Refill: Capillary refill takes less than 2 seconds.     Coloration: Skin is not jaundiced.  Neurological:     General: No focal deficit present.     Mental Status: She is alert and oriented to person, place, and time.  Psychiatric:        Mood and Affect: Mood normal.        Behavior: Behavior normal.   Last CBC Lab Results  Component Value Date   WBC 7.3 07/20/2022   HGB 13.6 07/20/2022   HCT 42.5 07/20/2022   MCV 93.6 07/20/2022   MCH 30.0 07/20/2022   RDW 13.1 07/20/2022   PLT 200 99991111   Last metabolic panel Lab Results  Component Value Date   GLUCOSE 116 (H) 07/20/2022   NA 139 07/20/2022   K 4.5 07/20/2022   CL 105 07/20/2022   CO2 26 07/20/2022   BUN 16 07/20/2022   CREATININE 1.07 (H) 07/20/2022   GFRNONAA 50 (L) 07/20/2022   CALCIUM 8.9 07/20/2022   PROT 7.0 07/20/2022   ALBUMIN 3.7 07/20/2022   BILITOT 0.7 07/20/2022   ALKPHOS 69 07/20/2022   AST 19 07/20/2022   ALT 12 07/20/2022   ANIONGAP 8 07/20/2022   Last thyroid functions Lab Results  Component Value Date   TSH 0.262 (L) 07/21/2022   Last vitamin D Lab Results  Component Value Date   VD25OH 28.6 (L) 07/21/2022   Last vitamin B12 and Folate Lab Results  Component Value Date   VITAMINB12 342 07/21/2022   FOLATE 5.3 07/21/2022     Assessment & Plan:   Problem List Items Addressed This Visit       Insomnia - Primary    Symptoms of not improved despite attempting to improve her sleep hygiene measures.  She is interested in starting an as needed medication today. -Ramelteon 8 mg nightly prescribed today.  If her insurance does not cover this medication, consider trazodone.  She is also prescribed mirtazapine so would need to take medication separately.  She is in agreement with this plan. -We will tentatively plan for follow-up in 6 months      Return in about 6 months (around 04/21/2023).   Johnette Abraham, MD

## 2022-10-21 NOTE — Assessment & Plan Note (Addendum)
Symptoms of not improved despite attempting to improve her sleep hygiene measures.  She is interested in starting an as needed medication today. -Ramelteon 8 mg nightly prescribed today.  If her insurance does not cover this medication, consider trazodone.  She is also prescribed mirtazapine so would need to take medication separately.  She is in agreement with this plan. -We will tentatively plan for follow-up in 6 months

## 2022-10-28 ENCOUNTER — Telehealth: Payer: Self-pay | Admitting: Internal Medicine

## 2022-10-28 ENCOUNTER — Encounter: Payer: Self-pay | Admitting: Internal Medicine

## 2022-10-28 NOTE — Telephone Encounter (Signed)
Trazodone 50 mg qhs prn has been discontinued. She will try melatonin again as this was previously effective for 2-3 days before she stopping taking it.

## 2022-10-28 NOTE — Telephone Encounter (Signed)
Called in on patient behalf In regard to  traZODone (DESYREL) 50 MG tablet    Patient was up all night after taking med.Karna Christmas wants a call back in regard

## 2022-11-18 ENCOUNTER — Ambulatory Visit (HOSPITAL_COMMUNITY)
Admission: RE | Admit: 2022-11-18 | Discharge: 2022-11-18 | Disposition: A | Discharge status: Home / Self Care | Payer: Medicare HMO | Source: Ambulatory Visit | Attending: Family Medicine | Admitting: Family Medicine

## 2022-11-18 ENCOUNTER — Ambulatory Visit (INDEPENDENT_AMBULATORY_CARE_PROVIDER_SITE_OTHER): Payer: Medicare HMO | Admitting: Family Medicine

## 2022-11-18 ENCOUNTER — Encounter: Payer: Self-pay | Admitting: Family Medicine

## 2022-11-18 VITALS — BP 134/76 | HR 87 | Ht 64.0 in | Wt 124.0 lb

## 2022-11-18 DIAGNOSIS — R0789 Other chest pain: Secondary | ICD-10-CM

## 2022-11-18 DIAGNOSIS — M5134 Other intervertebral disc degeneration, thoracic region: Secondary | ICD-10-CM | POA: Diagnosis not present

## 2022-11-18 DIAGNOSIS — M4185 Other forms of scoliosis, thoracolumbar region: Secondary | ICD-10-CM | POA: Diagnosis not present

## 2022-11-18 DIAGNOSIS — W19XXXA Unspecified fall, initial encounter: Secondary | ICD-10-CM | POA: Insufficient documentation

## 2022-11-18 DIAGNOSIS — R0781 Pleurodynia: Secondary | ICD-10-CM | POA: Diagnosis not present

## 2022-11-18 DIAGNOSIS — I700 Atherosclerosis of aorta: Secondary | ICD-10-CM | POA: Diagnosis not present

## 2022-11-18 DIAGNOSIS — S2231XA Fracture of one rib, right side, initial encounter for closed fracture: Secondary | ICD-10-CM | POA: Insufficient documentation

## 2022-11-18 DIAGNOSIS — S2241XA Multiple fractures of ribs, right side, initial encounter for closed fracture: Secondary | ICD-10-CM | POA: Diagnosis not present

## 2022-11-18 MED ORDER — HYDROCODONE-ACETAMINOPHEN 5-325 MG PO TABS: ORAL_TABLET | ORAL | 0 refills | Status: DC

## 2022-11-18 NOTE — Patient Instructions (Signed)
F/U with PCP in 1 to 2 weeks,  call if you need to be seen sooner  Please go  and get X rays today, my Nurse will call you today with the result  Hydrocodone is prescribed SHORT term for excess pain, I recommend ONE only ONCE daily ( script says up to 2, only take one)  You have enough for up to 10 days, only take if needed. Please take tylenol one every day for pain  Take deep breaths regularly, to reduce risk of pneumonia  Thanks for choosing [facility], we consider it a privelige to serve you.

## 2022-11-21 DIAGNOSIS — S2231XA Fracture of one rib, right side, initial encounter for closed fracture: Secondary | ICD-10-CM | POA: Insufficient documentation

## 2022-11-21 DIAGNOSIS — W19XXXA Unspecified fall, initial encounter: Secondary | ICD-10-CM | POA: Insufficient documentation

## 2022-11-21 DIAGNOSIS — R0789 Other chest pain: Secondary | ICD-10-CM | POA: Insufficient documentation

## 2022-11-21 HISTORY — DX: Fracture of one rib, right side, initial encounter for closed fracture: S22.31XA

## 2022-11-21 NOTE — Progress Notes (Signed)
   Meghan Oconnell     MRN: [id number]      DOB: June 10, 1931   HPI C/o right posterior chest pain , localized  worse up to a 10 with breathing which started on 11/14/2022 when she fell between a commode and the bathtub  in a hotel where she was staying. No h/o bruise or bleed, no head trauma associated with the fall, no LOC or change in memeory noted since fall This is the first medical eval since the injury    ROS Denies recent fever or chills. Denies sinus pressure, nasal congestion, ear pain or sore throat. Denies chest congestion, productive cough or wheezing. Denies  palpitations and leg swelling Denies abdominal pain, nausea, vomiting,diarrhea or constipation.   Denies dysuria, frequency, hesitancy or incontinence.. Denies headaches, seizures, numbness, or tingling. Denies uncontrolled  depression, anxiety or insomnia. Denies skin break down or rash.   PE  BP 134/76 (BP Location: Right Arm, Patient Position: Sitting, Cuff Size: Normal)   Pulse 87   Ht 5\' 4"  (1.626 m)   Wt 124 lb 0.6 oz (56.3 kg)   SpO2 95%   BMI 21.29 kg/m   Patient alert and oriented and in no cardiopulmonary distress.  HEENT: No facial asymmetry, EOMI,     Neck supple .  Chest: Clear to auscultation bilaterally.Localized tendederness over posterior right lower rib cage  CVS: S1, S2 no murmurs, no S3.Regular rate.  ABD: Soft non tender.   Ext: No edema  MS: decreased ROM  thoracolumbar , adequate in shoulders, hips and knees.  Skin: Intact, no ulcerations or rash noted.  Psych: Good eye contact, normal affect. Memory intact not anxious or depressed appearing.  CNS: CN 2-12 intact, power,  normal throughout.no focal deficits noted.   Assessment & Plan  Fall Exam c/w rib fracture, will obtain x ray of ribs and thoracic spine. Lilmted hydrocodone at bedtime as needed , ten total prescribed   Posterior chest pain Localized and worse with deep breaths. X ay confirmsrib fracture , pain  management and arrention to deep breathing to reduce risk of lung infection  Right rib fracture No pneumpothorax or fluid in lungs, minimally displaced 9th rib fracture

## 2022-11-21 NOTE — Assessment & Plan Note (Signed)
No pneumpothorax or fluid in lungs, minimally displaced 9th rib fracture

## 2022-11-21 NOTE — Assessment & Plan Note (Signed)
Localized and worse with deep breaths. X ay confirmsrib fracture , pain management and arrention to deep breathing to reduce risk of lung infection

## 2022-11-21 NOTE — Assessment & Plan Note (Signed)
Exam c/w rib fracture, will obtain x ray of ribs and thoracic spine. Lilmted hydrocodone at bedtime as needed , ten total prescribed

## 2022-12-01 ENCOUNTER — Ambulatory Visit (INDEPENDENT_AMBULATORY_CARE_PROVIDER_SITE_OTHER): Payer: Medicare HMO | Admitting: Internal Medicine

## 2022-12-01 ENCOUNTER — Encounter: Payer: Self-pay | Admitting: Internal Medicine

## 2022-12-01 VITALS — BP 131/69 | HR 75 | Resp 16 | Ht 64.5 in | Wt 122.0 lb

## 2022-12-01 DIAGNOSIS — S2231XA Fracture of one rib, right side, initial encounter for closed fracture: Secondary | ICD-10-CM

## 2022-12-01 NOTE — Progress Notes (Signed)
Established Patient Office Visit  Subjective   Patient ID: Meghan Oconnell, female    DOB: [birth year]  Age: 89 y.o. MRN: [id number]  Chief Complaint  Patient presents with   Fall    On March 11- had a rib fracture. Hurts her to lay down at night mainly    Ms. Meghan Oconnell returns to care today for follow-up in the setting of a nondisplaced fracture of the right ninth rib.  She suffered a fall on 3/11, falling backwards between a bathtub and commode a hotel she was staying in, landing on her back.  She experienced pain along the right posterior chest wall.  Evaluated by Dr. Moshe Cipro on 3/15.  X-rays obtained at that time demonstrated a nondisplaced fracture of the right ninth rib, which has been managed conservatively with pain control.  Today Ms. Meghan Oconnell states that her pain is improving.  She is able to deeply inspire without exacerbating her pain.  Pain is most noticeable at night when turning from side-to-side.  She has been taking Tylenol as needed for pain relief.  She has no additional concerns to discuss today.  Past Medical History:  Diagnosis Date   Breast cancer [facility])    Past Surgical History:  Procedure Laterality Date   BREAST LUMPECTOMY     CHOLECYSTECTOMY     HERNIA REPAIR     OTHER SURGICAL HISTORY     Colon Surgery   Social History   Tobacco Use   Smoking status: Never   Smokeless tobacco: Never  Vaping Use   Vaping Use: Never used  Substance Use Topics   Alcohol use: No    Alcohol/week: 0.0 standard drinks of alcohol   Drug use: No   Family History  Problem Relation Age of Onset   Cancer Sister    Allergies  Allergen Reactions   Ivp Dye [Iodinated Contrast Media] Rash    Blue Dye   Review of Systems  Musculoskeletal:        Right posterior chest wall pain  All other systems reviewed and are negative.    Objective:     BP 131/69   Pulse 75   Resp 16   Ht 5' 4.5" (1.638 m)   Wt 122 lb (55.3 kg)   SpO2 95%   BMI 20.62 kg/m  BP Readings from  Last 3 Encounters:  12/01/22 131/69  11/18/22 134/76  10/21/22 131/76   Physical Exam Vitals reviewed.  Constitutional:      General: She is not in acute distress.    Appearance: Normal appearance. She is not toxic-appearing.  HENT:     Head: Normocephalic and atraumatic.     Right Ear: External ear normal.     Left Ear: External ear normal.     Nose: Nose normal. No congestion or rhinorrhea.     Mouth/Throat:     Mouth: Mucous membranes are moist.     Pharynx: Oropharynx is clear. No oropharyngeal exudate or posterior oropharyngeal erythema.  Eyes:     General: No scleral icterus.    Extraocular Movements: Extraocular movements intact.     Conjunctiva/sclera: Conjunctivae normal.     Pupils: Pupils are equal, round, and reactive to light.  Cardiovascular:     Rate and Rhythm: Normal rate and regular rhythm.     Pulses: Normal pulses.     Heart sounds: Normal heart sounds. No murmur heard.    No friction rub. No gallop.  Pulmonary:     Effort: Pulmonary effort is normal.  Breath sounds: Normal breath sounds. No wheezing, rhonchi or rales.  Abdominal:     General: Abdomen is flat. Bowel sounds are normal. There is no distension.     Palpations: Abdomen is soft.     Tenderness: There is no abdominal tenderness. There is no right CVA tenderness, left CVA tenderness, guarding or rebound.  Musculoskeletal:        General: No swelling. Normal range of motion.     Cervical back: Normal range of motion.     Right lower leg: No edema.     Left lower leg: No edema.  Lymphadenopathy:     Cervical: No cervical adenopathy.  Skin:    General: Skin is warm and dry.     Capillary Refill: Capillary refill takes less than 2 seconds.     Coloration: Skin is not jaundiced.  Neurological:     General: No focal deficit present.     Mental Status: She is alert and oriented to person, place, and time.  Psychiatric:        Mood and Affect: Mood normal.        Behavior: Behavior normal.    Last CBC Lab Results  Component Value Date   WBC 7.3 07/20/2022   HGB 13.6 07/20/2022   HCT 42.5 07/20/2022   MCV 93.6 07/20/2022   MCH 30.0 07/20/2022   RDW 13.1 07/20/2022   PLT 200 99991111   Last metabolic panel Lab Results  Component Value Date   GLUCOSE 116 (H) 07/20/2022   NA 139 07/20/2022   K 4.5 07/20/2022   CL 105 07/20/2022   CO2 26 07/20/2022   BUN 16 07/20/2022   CREATININE 1.07 (H) 07/20/2022   GFRNONAA 50 (L) 07/20/2022   CALCIUM 8.9 07/20/2022   PROT 7.0 07/20/2022   ALBUMIN 3.7 07/20/2022   BILITOT 0.7 07/20/2022   ALKPHOS 69 07/20/2022   AST 19 07/20/2022   ALT 12 07/20/2022   ANIONGAP 8 07/20/2022   Last thyroid functions Lab Results  Component Value Date   TSH 0.262 (L) 07/21/2022   Last vitamin D Lab Results  Component Value Date   VD25OH 28.6 (L) 07/21/2022   Last vitamin B12 and Folate Lab Results  Component Value Date   VITAMINB12 342 07/21/2022   FOLATE 5.3 07/21/2022     Assessment & Plan:   Problem List Items Addressed This Visit       Right rib fracture - Primary    Presenting today for follow-up in the setting of a recent nondisplaced fracture of the right ninth rib.  Overall her pain is improving.  She is able to deeply inspire without exacerbation of her pain.  Pain is currently most notable at night.  In addition to Tylenol, I have recommended use of Voltaren gel or topical IcyHot cream.  We discussed that these injuries can take several weeks to heal but I anticipate that her pain will continue to gradually improve.  I also recommended that she practice deep inhalation exercises to assist with recovery. -Continue conservative treatment measures -She has previously scheduled follow-up with me in August, but was instructed return to care if her pain significantly worsens or fails to improve.       Return if symptoms worsen or fail to improve.    Johnette Abraham, MD

## 2022-12-01 NOTE — Patient Instructions (Signed)
It was a pleasure to see you today.  Thank you for giving [place] the opportunity to be involved in your care.  Below is a brief recap of your visit and next steps.  We will plan to see you again in August.  Summary I recommend trying [facility] or Voltaren gel for relief of back pain. Be sure to practice deep breathing exercises during the day to loosen the muscles around your ribs

## 2022-12-02 ENCOUNTER — Ambulatory Visit: Payer: Medicare HMO | Admitting: Internal Medicine

## 2022-12-07 ENCOUNTER — Encounter: Payer: Self-pay | Admitting: Internal Medicine

## 2022-12-07 NOTE — Assessment & Plan Note (Signed)
Presenting today for follow-up in the setting of a recent nondisplaced fracture of the right ninth rib.  Overall her pain is improving.  She is able to deeply inspire without exacerbation of her pain.  Pain is currently most notable at night.  In addition to Tylenol, I have recommended use of Voltaren gel or topical IcyHot cream.  We discussed that these injuries can take several weeks to heal but I anticipate that her pain will continue to gradually improve.  I also recommended that she practice deep inhalation exercises to assist with recovery. -Continue conservative treatment measures -She has previously scheduled follow-up with me in August, but was instructed return to care if her pain significantly worsens or fails to improve.

## 2022-12-22 ENCOUNTER — Ambulatory Visit (INDEPENDENT_AMBULATORY_CARE_PROVIDER_SITE_OTHER): Payer: Medicare HMO | Admitting: Family Medicine

## 2022-12-22 ENCOUNTER — Encounter: Payer: Self-pay | Admitting: Family Medicine

## 2022-12-22 VITALS — BP 117/81 | HR 78 | Ht 64.5 in | Wt 120.0 lb

## 2022-12-22 DIAGNOSIS — N390 Urinary tract infection, site not specified: Secondary | ICD-10-CM

## 2022-12-22 DIAGNOSIS — R300 Dysuria: Secondary | ICD-10-CM | POA: Diagnosis not present

## 2022-12-22 LAB — POCT URINALYSIS DIPSTICK
Bilirubin, UA: NEGATIVE
Glucose, UA: NEGATIVE
Ketones, UA: NEGATIVE
Nitrite, UA: POSITIVE
Protein, UA: NEGATIVE
Spec Grav, UA: >=1.03 — AB (ref 1.010–1.025)
Urobilinogen, UA: 0.2 E.U./dL
pH, UA: 6 (ref 5.0–8.0)

## 2022-12-22 MED ORDER — SULFAMETHOXAZOLE-TRIMETHOPRIM 800-160 MG PO TABS: 1.0000 | ORAL_TABLET | Freq: Two times a day (BID) | ORAL | 0 refills | Status: DC

## 2022-12-22 NOTE — Assessment & Plan Note (Signed)
Urinalysis, and urine culture ordered- Awaiting results will follow up.  Bactrim x 7 days. Explained to increase oral fluid intake. Drink 8 glasses of water daily.  Follow up for worsening or persistent symptoms. Patient verbalizes understanding regarding plan of care and all questions answered.

## 2022-12-22 NOTE — Progress Notes (Signed)
Patient Office Visit   Subjective   Patient ID: Meghan Oconnell, female    DOB: [birth year]  Age: 89 y.o. MRN: [id number]  CC:  Chief Complaint  Patient presents with   Dysuria    Reoccurring in the last 3 days    HPI Meghan Oconnell 89 year old female, presents to the clinic for dysuria started 3 days ago. She  has a past medical history of Breast cancer.  Urinary Tract Infection  This is a new problem. Patient reports dysuria occurs every urination and has been gradually worsening. The quality of the pain is described as aching and burning. The pain is at a severity of 6/10. There has been no fever. She is not sexually active. Associated symptoms include frequency, hesitancy and urgency. Pertinent negatives include no chills, discharge, flank pain, hematuria, nausea or vomiting. She has tried increased fluids for the symptoms. The treatment provided no relief.      Outpatient Encounter Medications as of 12/22/2022  Medication Sig   atorvastatin (LIPITOR) 20 MG tablet    Cranberry-Vitamin C 250-60 MG CAPS Take 1 capsule by mouth in the morning and at bedtime.   mirtazapine (REMERON) 15 MG tablet Take 1 tablet (15 mg total) by mouth at bedtime.   Probiotic Product (CULTURELLE PROBIOTICS PO) Take 1 capsule by mouth daily.   sulfamethoxazole-trimethoprim (BACTRIM DS) 800-160 MG tablet Take 1 tablet by mouth 2 (two) times daily for 7 days.   HYDROcodone-acetaminophen (NORCO/VICODIN) 5-325 MG tablet Take one tablet by mouth two times daily, as needed, for severe pain (Patient not taking: Reported on 12/22/2022)   ondansetron (ZOFRAN-ODT) 4 MG disintegrating tablet  ODT q4 hours prn nausea/vomit (Patient not taking: Reported on 12/22/2022)   No facility-administered encounter medications on file as of 12/22/2022.    Past Surgical History:  Procedure Laterality Date   BREAST LUMPECTOMY     CHOLECYSTECTOMY     HERNIA REPAIR     OTHER SURGICAL HISTORY     Colon Surgery     Review of Systems  Constitutional:  Negative for chills and fever.  Eyes:  Negative for blurred vision.  Respiratory:  Negative for shortness of breath.   Cardiovascular:  Negative for chest pain.  Gastrointestinal:  Negative for abdominal pain, nausea and vomiting.  Genitourinary:  Positive for dysuria, frequency and urgency. Negative for flank pain and hematuria.  Musculoskeletal:  Negative for myalgias.  Neurological:  Negative for dizziness and headaches.      Objective    BP 117/81   Pulse 78   Ht 5' 4.5" (1.638 m)   Wt 120 lb (54.4 kg)   SpO2 96%   BMI 20.28 kg/m   Physical Exam Vitals reviewed.  Constitutional:      General: She is not in acute distress.    Appearance: Normal appearance. She is not ill-appearing, toxic-appearing or diaphoretic.  HENT:     Head: Normocephalic.  Eyes:     General:        Right eye: No discharge.        Left eye: No discharge.     Conjunctiva/sclera: Conjunctivae normal.  Cardiovascular:     Rate and Rhythm: Normal rate.     Pulses: Normal pulses.     Heart sounds: Normal heart sounds.  Pulmonary:     Effort: Pulmonary effort is normal. No respiratory distress.     Breath sounds: Normal breath sounds.  Abdominal:     General: Bowel sounds are normal.  Palpations: Abdomen is soft.     Tenderness: There is no abdominal tenderness. There is no right CVA tenderness, left CVA tenderness or guarding.  Musculoskeletal:        General: Normal range of motion.     Cervical back: Normal range of motion.  Skin:    General: Skin is warm and dry.     Capillary Refill: Capillary refill takes less than 2 seconds.  Neurological:     General: No focal deficit present.     Mental Status: She is alert and oriented to person, place, and time.     Coordination: Coordination normal.     Gait: Gait normal.  Psychiatric:        Mood and Affect: Mood normal.        Behavior: Behavior normal.       Assessment & Plan:  Urinary tract  infection without hematuria, site unspecified Assessment & Plan: Urinalysis, and urine culture ordered- Awaiting results will follow up.  Bactrim x 7 days. Explained to increase oral fluid intake. Drink 8 glasses of water daily.  Follow up for worsening or persistent symptoms. Patient verbalizes understanding regarding plan of care and all questions answered.   Orders: -     Sulfamethoxazole-Trimethoprim; Take 1 tablet by mouth 2 (two) times daily for 7 days.  Dispense: 14 tablet; Refill: 0  Dysuria -     POCT urinalysis dipstick -     Urine Culture -     Urinalysis    No follow-ups on file.   Cruzita Lederer Newman Nip, FNP

## 2022-12-22 NOTE — Patient Instructions (Signed)
It was pleasure meeting with you today. Please take medications as prescribed. Follow up with your primary health provider if any health concerns arises. If symptoms worsen please contact your primary care provider and/or visit the emergency department.  

## 2022-12-23 LAB — URINALYSIS
Bilirubin, UA: NEGATIVE
Glucose, UA: NEGATIVE
Ketones, UA: NEGATIVE
Nitrite, UA: NEGATIVE
Specific Gravity, UA: 1.015 (ref 1.005–1.030)
Urobilinogen, Ur: 0.2 mg/dL (ref 0.2–1.0)
pH, UA: 6.5 (ref 5.0–7.5)

## 2022-12-26 ENCOUNTER — Telehealth: Payer: Self-pay | Admitting: Family Medicine

## 2022-12-26 ENCOUNTER — Other Ambulatory Visit: Payer: Self-pay | Admitting: Internal Medicine

## 2022-12-26 DIAGNOSIS — N3001 Acute cystitis with hematuria: Secondary | ICD-10-CM

## 2022-12-26 MED ORDER — NITROFURANTOIN MONOHYD MACRO 100 MG PO CAPS: 100.0000 mg | ORAL_CAPSULE | Freq: Two times a day (BID) | ORAL | 0 refills | Status: AC

## 2022-12-26 NOTE — Telephone Encounter (Signed)
Call patient at [phone] (terry her daugther)

## 2022-12-26 NOTE — Telephone Encounter (Signed)
Patient calling about lab results on her UTI last week.

## 2022-12-26 NOTE — Progress Notes (Signed)
Patient's daughter, Starr Sinclair, called to follow-up today on urinalysis results from last week.  Patient was evaluated at [facility] in the setting of UTI symptoms.  Empirically treated with Bactrim.  Urine culture is still pending.  Terri reports that her mother has stopped taking Bactrim because she is concerned that it is causing adverse side effects.  Of note, last treated by me for UTI in January 2024.  Urine culture at that time resulted with E. coli resistant to Bactrim, ampicillin, and tetracyclines.  With this in mind, will switch to Macrobid 100 mg twice daily x 5 days.  Urine culture results pending.

## 2022-12-27 NOTE — Telephone Encounter (Signed)
LVM

## 2022-12-28 ENCOUNTER — Telehealth: Payer: Self-pay | Admitting: Internal Medicine

## 2022-12-28 NOTE — Telephone Encounter (Signed)
Spoke with Aurther Loft about UTI.

## 2022-12-28 NOTE — Telephone Encounter (Signed)
Patient daughter returning a call

## 2022-12-28 NOTE — Telephone Encounter (Signed)
Spoke with Terry.

## 2022-12-28 NOTE — Telephone Encounter (Signed)
Daughter returning phone call

## 2022-12-30 ENCOUNTER — Telehealth: Payer: Self-pay | Admitting: Internal Medicine

## 2022-12-30 NOTE — Telephone Encounter (Signed)
error 

## 2022-12-30 NOTE — Telephone Encounter (Signed)
Patient daughter Aurther Loft called concern of dehydration keeps licking her tongue out. Front staff tried to schedule in office visit, but daughter said if patient no better by Monday will call office back.

## 2023-01-02 NOTE — Telephone Encounter (Signed)
Left message for patient to contact our office for an appointment

## 2023-01-19 ENCOUNTER — Other Ambulatory Visit: Payer: Self-pay | Admitting: Internal Medicine

## 2023-01-23 ENCOUNTER — Telehealth: Payer: Self-pay | Admitting: Internal Medicine

## 2023-01-23 ENCOUNTER — Other Ambulatory Visit (INDEPENDENT_AMBULATORY_CARE_PROVIDER_SITE_OTHER): Payer: Medicare HMO

## 2023-01-23 DIAGNOSIS — R300 Dysuria: Secondary | ICD-10-CM | POA: Diagnosis not present

## 2023-01-23 DIAGNOSIS — N3001 Acute cystitis with hematuria: Secondary | ICD-10-CM

## 2023-01-23 LAB — POCT URINALYSIS DIP (CLINITEK)
Bilirubin, UA: NEGATIVE
Glucose, UA: NEGATIVE mg/dL
Ketones, POC UA: NEGATIVE mg/dL
Nitrite, UA: POSITIVE — AB
POC PROTEIN,UA: NEGATIVE
Spec Grav, UA: 1.01 (ref 1.010–1.025)
Urobilinogen, UA: 0.2 E.U./dL
pH, UA: 6 (ref 5.0–8.0)

## 2023-01-23 MED ORDER — NITROFURANTOIN MONOHYD MACRO 100 MG PO CAPS: 100.0000 mg | ORAL_CAPSULE | Freq: Two times a day (BID) | ORAL | 0 refills | Status: AC

## 2023-01-23 NOTE — Telephone Encounter (Signed)
Pts daughter called in regards to her mother. She stated her mother has a UTI again, and would like some medication sent to her phar. The soonest appt we have is Wednesday, she does not want to wait that long. Please follow up with pts daughter in regards.      nitrofurantoin, macrocrystal-monohydrate, (MACROBID) 100 MG capsule [[id number]]

## 2023-01-23 NOTE — Telephone Encounter (Signed)
Macrobid sent for reinfection of urinary tract.  Follow culture results.

## 2023-01-23 NOTE — Assessment & Plan Note (Addendum)
Review showed patient treated for E.Coli UTI in January. Presented in April with another UTI and treated with Bactrim. No culture data from April.  Patient unable to be put on schedule today. Request made to bring urine sample. Reviewed POC UA and positive for nitrites, leukocytes, and hematuria.  Macrobid 100 mg BID for 7 days Urine culture sent

## 2023-01-23 NOTE — Telephone Encounter (Signed)
Will bring sample by.

## 2023-01-27 LAB — MICROSCOPIC EXAMINATION
Bacteria, UA: NONE SEEN
Casts: NONE SEEN /lpf
RBC, Urine: NONE SEEN /hpf (ref 0–2)
WBC, UA: >30 /hpf — AB (ref 0–5)

## 2023-01-27 LAB — UA/M W/RFLX CULTURE, ROUTINE
Bilirubin, UA: NEGATIVE
Glucose, UA: NEGATIVE
Ketones, UA: NEGATIVE
Nitrite, UA: POSITIVE — AB
Protein,UA: NEGATIVE
Specific Gravity, UA: 1.009 (ref 1.005–1.030)
Urobilinogen, Ur: 0.2 mg/dL (ref 0.2–1.0)
pH, UA: 6 (ref 5.0–7.5)

## 2023-01-27 LAB — URINE CULTURE, REFLEX

## 2023-02-10 ENCOUNTER — Telehealth: Payer: Self-pay | Admitting: Internal Medicine

## 2023-02-10 NOTE — Telephone Encounter (Signed)
Patient aware.

## 2023-02-10 NOTE — Telephone Encounter (Signed)
Patient daughter Camelia Eng called leaving on vacation 06.22.2024 for a week asked if Dr Durwin Nora can send in a prescription for UTI incase she gets a uti to keep her out of a urgent care.   Terri call back # [phone]  Pharmacy: [facility]

## 2023-02-23 ENCOUNTER — Telehealth: Payer: Self-pay | Admitting: Internal Medicine

## 2023-02-23 ENCOUNTER — Other Ambulatory Visit: Payer: Self-pay

## 2023-02-23 MED ORDER — MIRTAZAPINE 15 MG PO TABS: 15.0000 mg | ORAL_TABLET | Freq: Every day | ORAL | 2 refills | Status: DC

## 2023-02-23 NOTE — Telephone Encounter (Signed)
Refills sent to pharmacy. 

## 2023-02-23 NOTE — Telephone Encounter (Signed)
Prescription Request  02/23/2023  LOV: 12/01/2022  What is the name of the medication or equipment? mirtazapine (REMERON) 15 MG tablet   Have you contacted your pharmacy to request a refill? Yes   Which pharmacy would you like this sent to?  [facility] - [place], [place] - [address] [place] [address] Phone: [phone] Fax: [phone]    Patient notified that their request is being sent to the clinical staff for review and that they should receive a response within 2 business days.   Please advise at Mobile There is no such number on file (mobile).

## 2023-03-19 ENCOUNTER — Other Ambulatory Visit: Payer: Self-pay

## 2023-03-19 ENCOUNTER — Encounter: Payer: Self-pay | Admitting: Emergency Medicine

## 2023-03-19 ENCOUNTER — Ambulatory Visit
Admission: EM | Admit: 2023-03-19 | Discharge: 2023-03-19 | Disposition: A | Discharge status: Home / Self Care | Payer: Medicare HMO | Attending: Family Medicine | Admitting: Family Medicine

## 2023-03-19 DIAGNOSIS — N390 Urinary tract infection, site not specified: Secondary | ICD-10-CM

## 2023-03-19 LAB — POCT URINALYSIS DIP (MANUAL ENTRY)
Bilirubin, UA: NEGATIVE
Glucose, UA: NEGATIVE mg/dL
Ketones, POC UA: NEGATIVE mg/dL
Nitrite, UA: POSITIVE — AB
Protein Ur, POC: NEGATIVE mg/dL
Spec Grav, UA: 1.015 (ref 1.010–1.025)
Urobilinogen, UA: 0.2 E.U./dL
pH, UA: 6 (ref 5.0–8.0)

## 2023-03-19 MED ORDER — CEPHALEXIN 500 MG PO CAPS: 500.0000 mg | ORAL_CAPSULE | Freq: Two times a day (BID) | ORAL | 0 refills | Status: DC

## 2023-03-19 NOTE — ED Triage Notes (Signed)
Pt reports dysuria x1 week. Pt reports history of similar and reports has taken AZO this episode as well.

## 2023-03-19 NOTE — Discharge Instructions (Signed)
Your urinalysis today shows a urinary tract infection.  I have sent over an antibiotic for this.  Drink plenty of fluids and empty your bladder fully each time.  We have sent out for a urine culture and if this is that we need to change anything we will give you a call and let you know

## 2023-03-19 NOTE — ED Provider Notes (Signed)
[facility]    CSN: [id number] Arrival date & time: 03/19/23  1041      History   Chief Complaint Chief Complaint  Patient presents with   Dysuria    HPI Meghan Oconnell is a 89 y.o. female.   Patient presenting today with about a week of dysuria, urinary urgency.  Denies fever, chills, abdominal pain, nausea vomiting or diarrhea, flank pain, hematuria.  So far trying Azo with minimal relief.    Past Medical History:  Diagnosis Date   Breast cancer [facility])     Patient Active Problem List   Diagnosis Date Noted   Posterior chest pain 11/21/2022   Fall 11/21/2022   Right rib fracture 11/21/2022   Acute cystitis with hematuria 09/19/2022   History of breast cancer 07/27/2022   HLD (hyperlipidemia) 07/27/2022   Urinary tract infection 07/27/2022   Insomnia 07/27/2022   Encounter for general adult medical examination with abnormal findings 07/27/2022   Multinodular goiter 06/15/2015    Past Surgical History:  Procedure Laterality Date   BREAST LUMPECTOMY     CHOLECYSTECTOMY     HERNIA REPAIR     OTHER SURGICAL HISTORY     Colon Surgery    OB History   No obstetric history on file.      Home Medications    Prior to Admission medications   Medication Sig Start Date End Date Taking? Authorizing Provider  cephALEXin (KEFLEX) 500 MG capsule Take 1 capsule (500 mg total) by mouth 2 (two) times daily. 03/19/23  Yes Particia Nearing, PA-C  atorvastatin (LIPITOR) 20 MG tablet TAKE ONE TABLET BY MOUTH ONCE DAILY. 01/19/23   Billie Lade, MD  Cranberry-Vitamin C 250-60 MG CAPS Take 1 capsule by mouth at bedtime.    [provider]  HYDROcodone-acetaminophen (NORCO/VICODIN) 5-325 MG tablet Take one tablet by mouth two times daily, as needed, for severe pain Patient not taking: Reported on 12/22/2022 11/18/22   Kerri Perches, MD  mirtazapine (REMERON) 15 MG tablet Take 1 tablet (15 mg total) by mouth at bedtime. 02/23/23   Anabel Halon, MD  ondansetron (ZOFRAN-ODT) 4 MG disintegrating tablet 4mg  ODT q4 hours prn nausea/vomit Patient not taking: Reported on 12/22/2022 07/20/22   Bethann Berkshire, MD  Probiotic Product (CULTURELLE PROBIOTICS PO) Take 1 capsule by mouth daily.    [provider]    Family History Family History  Problem Relation Age of Onset   Cancer Sister     Social History Social History   Tobacco Use   Smoking status: Never   Smokeless tobacco: Never  Vaping Use   Vaping status: Never Used  Substance Use Topics   Alcohol use: No    Alcohol/week: 0.0 standard drinks of alcohol   Drug use: No     Allergies   Ciprofloxacin and Ivp dye [iodinated contrast media]   Review of Systems Review of Systems Per HPI  Physical Exam Triage Vital Signs ED Triage Vitals  Encounter Vitals Group     BP 03/19/23 1049 (!) 143/81     Systolic BP Percentile --      Diastolic BP Percentile --      Pulse Rate 03/19/23 1049 93     Resp 03/19/23 1049 20     Temp 03/19/23 1049 (!) 97.4 F (36.3 C)     Temp Source 03/19/23 1049 Oral     SpO2 03/19/23 1049 93 %     Weight --      Height --  Head Circumference --      Peak Flow --      Pain Score 03/19/23 1050 2     Pain Loc --      Pain Education --      Exclude from Growth Chart --    No data found.  Updated Vital Signs BP (!) 143/81 (BP Location: Right Arm)   Pulse 93   Temp (!) 97.4 F (36.3 C) (Oral)   Resp 20   SpO2 93%   Visual Acuity Right Eye Distance:   Left Eye Distance:   Bilateral Distance:    Right Eye Near:   Left Eye Near:    Bilateral Near:     Physical Exam Vitals and nursing note reviewed.  Constitutional:      Appearance: Normal appearance. She is not ill-appearing.  HENT:     Head: Atraumatic.  Eyes:     Extraocular Movements: Extraocular movements intact.     Conjunctiva/sclera: Conjunctivae normal.  Cardiovascular:     Rate and Rhythm: Normal rate and regular rhythm.     Heart sounds:  Normal heart sounds.  Pulmonary:     Effort: Pulmonary effort is normal.     Breath sounds: Normal breath sounds.  Abdominal:     General: Bowel sounds are normal. There is no distension.     Palpations: Abdomen is soft.     Tenderness: There is no abdominal tenderness. There is no right CVA tenderness, left CVA tenderness or guarding.  Musculoskeletal:        General: Normal range of motion.     Cervical back: Normal range of motion and neck supple.  Skin:    General: Skin is warm and dry.  Neurological:     Mental Status: She is alert and oriented to person, place, and time.  Psychiatric:        Mood and Affect: Mood normal.        Thought Content: Thought content normal.        Judgment: Judgment normal.      UC Treatments / Results  Labs (all labs ordered are listed, but only abnormal results are displayed) Labs Reviewed  POCT URINALYSIS DIP (MANUAL ENTRY) - Abnormal; Notable for the following components:      Result Value   Clarity, UA cloudy (*)    Blood, UA small (*)    Nitrite, UA Positive (*)    Leukocytes, UA Large (3+) (*)    All other components within normal limits  URINE CULTURE    EKG   Radiology No results found.  Procedures Procedures (including critical care time)  Medications Ordered in UC Medications - No data to display  Initial Impression / Assessment and Plan / UC Course  I have reviewed the triage vital signs and the nursing notes.  Pertinent labs & imaging results that were available during my care of the patient were reviewed by me and considered in my medical decision making (see chart for details).     Urinalysis positive for urinary tract infection.  Treat with Keflex, await urine culture and adjust if needed.  Push fluids.  Return for worsening symptoms.  Final Clinical Impressions(s) / UC Diagnoses   Final diagnoses:  Acute lower UTI     Discharge Instructions      Your urinalysis today shows a urinary tract infection.   I have sent over an antibiotic for this.  Drink plenty of fluids and empty your bladder fully each time.  We have sent  out for a urine culture and if this is that we need to change anything we will give you a call and let you know    ED Prescriptions     Medication Sig Dispense Auth. Provider   cephALEXin (KEFLEX) 500 MG capsule Take 1 capsule (500 mg total) by mouth 2 (two) times daily. 10 capsule Particia Nearing, [place]      PDMP not reviewed this encounter.   Particia Nearing, [place] 03/19/23 1211

## 2023-03-19 NOTE — ED Notes (Signed)
Unable to provide urine sample. Pt drinking water at this time.

## 2023-03-19 NOTE — ED Notes (Signed)
After 3 bottles of water pt states she cannot urinate

## 2023-03-20 LAB — URINE CULTURE

## 2023-03-21 LAB — URINE CULTURE: Culture: 30000 — AB

## 2023-04-21 ENCOUNTER — Ambulatory Visit (INDEPENDENT_AMBULATORY_CARE_PROVIDER_SITE_OTHER): Payer: Medicare HMO | Admitting: Internal Medicine

## 2023-04-21 ENCOUNTER — Encounter: Payer: Self-pay | Admitting: Internal Medicine

## 2023-04-21 VITALS — BP 108/69 | HR 86 | Ht 64.5 in | Wt 121.4 lb

## 2023-04-21 DIAGNOSIS — Z13820 Encounter for screening for osteoporosis: Secondary | ICD-10-CM

## 2023-04-21 DIAGNOSIS — G4700 Insomnia, unspecified: Secondary | ICD-10-CM

## 2023-04-21 DIAGNOSIS — R630 Anorexia: Secondary | ICD-10-CM | POA: Diagnosis not present

## 2023-04-21 NOTE — Assessment & Plan Note (Signed)
DEXA ordered today.  

## 2023-04-21 NOTE — Progress Notes (Signed)
Established Patient Office Visit  Subjective   Patient ID: Meghan Oconnell, female    DOB: [birth year]  Age: 89 y.o. MRN: [id number]  Chief Complaint  Patient presents with   Care Management    6 month f/u, would ike to discuss appetite decreasing, also having trouble sleeping    Meghan Oconnell returns to care today for routine follow-up.  She was last evaluated by me on 3/28 in the setting of a right rib fracture.  In the interim, she presented to the emergency department on 7/14 endorsing symptoms concerning for UTI.  Treated with Keflex.  There have otherwise been no acute interval events.  Meghan Oconnell reports feeling fairly well today.  Her acute concern is insomnia.  She does not sleep well at night.  She is interested in additional medication options for treatment of insomnia.  She is accompanied by her daughter, Meghan Oconnell, today who is concerned about her mother's lack of appetite.  Meghan Oconnell eats breakfast each morning but does not eat much for dinner.  She frequently does not eat lunch.  She states that she simply does not feel hungry.  Her weight today is 121 pounds.  She weighed 122 pounds when last evaluated by me in March.  Past Medical History:  Diagnosis Date   Breast cancer (HCC)    Past Surgical History:  Procedure Laterality Date   BREAST LUMPECTOMY     CHOLECYSTECTOMY     HERNIA REPAIR     OTHER SURGICAL HISTORY     Colon Surgery   Social History   Tobacco Use   Smoking status: Never   Smokeless tobacco: Never  Vaping Use   Vaping status: Never Used  Substance Use Topics   Alcohol use: No    Alcohol/week: 0.0 standard drinks of alcohol   Drug use: No   Family History  Problem Relation Age of Onset   Cancer Sister    Allergies  Allergen Reactions   Ciprofloxacin     nausea   Ivp Dye [Iodinated Contrast Media] Rash    Blue Dye   Review of Systems  Constitutional:        Loss of appetite  Psychiatric/Behavioral:  The patient has insomnia.   All  other systems reviewed and are negative.    Objective:     BP 108/69   Pulse 86   Ht 5' 4.5" (1.638 m)   Wt 121 lb 6.4 oz (55.1 kg)   SpO2 97%   BMI 20.52 kg/m  BP Readings from Last 3 Encounters:  04/21/23 108/69  03/19/23 (!) 143/81  12/22/22 117/81   Physical Exam Vitals reviewed.  Constitutional:      General: She is not in acute distress.    Appearance: Normal appearance. She is not toxic-appearing.  HENT:     Head: Normocephalic and atraumatic.     Right Ear: External ear normal.     Left Ear: External ear normal.     Nose: Nose normal. No congestion or rhinorrhea.     Mouth/Throat:     Mouth: Mucous membranes are moist.     Pharynx: Oropharynx is clear. No oropharyngeal exudate or posterior oropharyngeal erythema.  Eyes:     General: No scleral icterus.    Extraocular Movements: Extraocular movements intact.     Conjunctiva/sclera: Conjunctivae normal.     Pupils: Pupils are equal, round, and reactive to light.  Cardiovascular:     Rate and Rhythm: Normal rate and regular rhythm.     Pulses:  Normal pulses.     Heart sounds: Normal heart sounds. No murmur heard.    No friction rub. No gallop.  Pulmonary:     Effort: Pulmonary effort is normal.     Breath sounds: Normal breath sounds. No wheezing, rhonchi or rales.  Abdominal:     General: Abdomen is flat. Bowel sounds are normal. There is no distension.     Palpations: Abdomen is soft.     Tenderness: There is no abdominal tenderness. There is no right CVA tenderness, left CVA tenderness, guarding or rebound.  Musculoskeletal:        General: No swelling. Normal range of motion.     Cervical back: Normal range of motion.     Right lower leg: No edema.     Left lower leg: No edema.  Lymphadenopathy:     Cervical: No cervical adenopathy.  Skin:    General: Skin is warm and dry.     Capillary Refill: Capillary refill takes less than 2 seconds.     Coloration: Skin is not jaundiced.  Neurological:      General: No focal deficit present.     Mental Status: She is alert and oriented to person, place, and time.  Psychiatric:        Mood and Affect: Mood normal.        Behavior: Behavior normal.   Last CBC Lab Results  Component Value Date   WBC 7.3 07/20/2022   HGB 13.6 07/20/2022   HCT 42.5 07/20/2022   MCV 93.6 07/20/2022   MCH 30.0 07/20/2022   RDW 13.1 07/20/2022   PLT 200 07/20/2022   Last metabolic panel Lab Results  Component Value Date   GLUCOSE 116 (H) 07/20/2022   NA 139 07/20/2022   K 4.5 07/20/2022   CL 105 07/20/2022   CO2 26 07/20/2022   BUN 16 07/20/2022   CREATININE 1.07 (H) 07/20/2022   GFRNONAA 50 (L) 07/20/2022   CALCIUM 8.9 07/20/2022   PROT 7.0 07/20/2022   ALBUMIN 3.7 07/20/2022   BILITOT 0.7 07/20/2022   ALKPHOS 69 07/20/2022   AST 19 07/20/2022   ALT 12 07/20/2022   ANIONGAP 8 07/20/2022   Last thyroid functions Lab Results  Component Value Date   TSH 0.262 (L) 07/21/2022   Last vitamin D Lab Results  Component Value Date   VD25OH 28.6 (L) 07/21/2022   Last vitamin B12 and Folate Lab Results  Component Value Date   VITAMINB12 342 07/21/2022   FOLATE 5.3 07/21/2022     Assessment & Plan:   Problem List Items Addressed This Visit       Insomnia    Persistent issue.  She continues to endorse insomnia today.  Symptoms did not improve with addressing sleep hygiene measures.  She has previously tried trazodone, which was not effective.  Ramelteon is not covered by insurance.  She states that if she takes Tylenol in the evening she will fall asleep quickly. -I reviewed with Ms. Warsame that I would prefer she take Tylenol in the evening as this is a safer option with lower risk of adverse side effects than many of the remaining medication options for management of insomnia.  I have also recommended that she try ZzzQuil.      Loss of appetite    Her daughter is concerned about her loss of appetite.  She does not eat as much food as she  used to.  She is currently prescribed mirtazapine but still struggles to eat 3  meals daily.  Her weight is stable. -No acute exam findings or underlying metabolic abnormalities as a result of her loss of appetite.  Likely related to age.  Continue Remeron and will monitor weight.      Osteoporosis screening - Primary    DEXA ordered today      Return in about 6 months (around 10/22/2023).   Billie Lade, MD

## 2023-04-21 NOTE — Patient Instructions (Signed)
It was a pleasure to see you today.  Thank you for giving [place] the opportunity to be involved in your care.  Below is a brief recap of your visit and next steps.  We will plan to see you again in 6 months.  Summary It is OK to take Tylenol at night for sleep if needed I recommend trying ZzzQuil for sleep Bone density scan ordered Follow up in 6 months

## 2023-04-21 NOTE — Assessment & Plan Note (Signed)
Her daughter is concerned about her loss of appetite.  She does not eat as much food as she used to.  She is currently prescribed mirtazapine but still struggles to eat 3 meals daily.  Her weight is stable. -No acute exam findings or underlying metabolic abnormalities as a result of her loss of appetite.  Likely related to age.  Continue Remeron and will monitor weight.

## 2023-04-21 NOTE — Assessment & Plan Note (Signed)
Persistent issue.  She continues to endorse insomnia today.  Symptoms did not improve with addressing sleep hygiene measures.  She has previously tried trazodone, which was not effective.  Ramelteon is not covered by insurance.  She states that if she takes Tylenol in the evening she will fall asleep quickly. -I reviewed with Ms. Blasco that I would prefer she take Tylenol in the evening as this is a safer option with lower risk of adverse side effects than many of the remaining medication options for management of insomnia.  I have also recommended that she try ZzzQuil.

## 2023-04-26 ENCOUNTER — Other Ambulatory Visit: Payer: Self-pay | Admitting: Internal Medicine

## 2023-05-31 ENCOUNTER — Other Ambulatory Visit: Payer: Self-pay | Admitting: Internal Medicine

## 2023-07-14 ENCOUNTER — Other Ambulatory Visit: Payer: Self-pay | Admitting: Internal Medicine

## 2023-07-14 MED ORDER — ATORVASTATIN CALCIUM 20 MG PO TABS: 20.0000 mg | ORAL_TABLET | Freq: Every day | ORAL | 1 refills | Status: DC

## 2023-07-14 NOTE — Telephone Encounter (Signed)
Copied from CRM [id number]. Topic: Clinical - Medication Refill >> Jul 14, 2023  2:53 PM Desma Mcgregor wrote: Most Recent Primary Care Visit:  Provider: Billie Lade  Department: [facility]  Visit Type: OFFICE VISIT  Date: 04/21/2023  Medication: ***  Has the patient contacted their pharmacy?  (Agent: If no, request that the patient contact the pharmacy for the refill. If patient does not wish to contact the pharmacy document the reason why and proceed with request.) (Agent: If yes, when and what did the pharmacy advise?)  Is this the correct pharmacy for this prescription?  If no, delete pharmacy and type the correct one.  This is the patient's preferred pharmacy:  [facility] - [place], [place] - [address] [place] [address] Phone: [phone] Fax: [phone]  [facility] [phone] - [place], [place] - [address] [facility] [address]  [place] [address] Phone: [phone] Fax: [phone]   Has the prescription been filled recently?   Is the patient out of the medication?   Has the patient been seen for an appointment in the last year OR does the patient have an upcoming appointment?   Can we respond through MyChart?   Agent: Please be advised that Rx refills may take up to 3 business days. We ask that you follow-up with your pharmacy.

## 2023-08-28 ENCOUNTER — Ambulatory Visit (INDEPENDENT_AMBULATORY_CARE_PROVIDER_SITE_OTHER): Payer: Medicare HMO

## 2023-08-28 VITALS — Ht 64.5 in | Wt 120.0 lb

## 2023-08-28 DIAGNOSIS — Z0000 Encounter for general adult medical examination without abnormal findings: Secondary | ICD-10-CM | POA: Diagnosis not present

## 2023-08-28 NOTE — Patient Instructions (Signed)
Meghan Oconnell , Thank you for taking time to come for your Medicare Wellness Visit. I appreciate your ongoing commitment to your health goals. Please review the following plan we discussed and let me know if I can assist you in the future.   Referrals/Orders/Follow-Ups/Clinician Recommendations: Client understands the importance of follow-up with providers by attending scheduled visits.  This is a list of the screening recommended for you and due dates:  Health Maintenance  Topic Date Due   DTaP/Tdap/Td vaccine (1 - Tdap) Never done   Zoster (Shingles) Vaccine (1 of 2) Never done   Pneumonia Vaccine (1 of 1 - PCV) Never done   DEXA scan (bone density measurement)  Never done   Flu Shot  Never done   COVID-19 Vaccine (3 - 2024-25 season) 05/07/2023   Medicare Annual Wellness Visit  08/27/2024   HPV Vaccine  Aged Out    Advanced directives: (Declined) Advance directive discussed with you today. Even though you declined this today, please call our office should you change your mind, and we can give you the proper paperwork for you to fill out.  Next Medicare Annual Wellness Visit scheduled for next year: No

## 2023-08-28 NOTE — Progress Notes (Signed)
Subjective:   Meghan Oconnell is a 89 y.o. female who presents for Medicare Annual (Subsequent) preventive examination.  Visit Complete: Virtual I connected with  Isaiah Blakes on 08/28/23 by a audio enabled telemedicine application and verified that I am speaking with the correct person using two identifiers.  Patient Location: Home  Provider Location: Home Office  I discussed the limitations of evaluation and management by telemedicine. The patient expressed understanding and agreed to proceed.  Vital Signs: Because this visit was a virtual/telehealth visit, some criteria may be missing or patient reported. Any vitals not documented were not able to be obtained and vitals that have been documented are patient reported.  Cardiac Risk Factors include: advanced age (>33men, >16 women);sedentary lifestyle     Objective:    Today's Vitals   08/28/23 1303  Weight: 120 lb (54.4 kg)  Height: 5' 4.5" (1.638 m)  PainSc: 0-No pain   Body mass index is 20.28 kg/m.     08/28/2023    1:05 PM 08/01/2022   11:49 AM 07/20/2022   10:17 AM 03/10/2021    1:26 PM 10/16/2019    1:25 PM  Advanced Directives  Does Patient Have a Medical Advance Directive? No No No No No  Would patient like information on creating a medical advance directive? No - Patient declined No - Patient declined  No - Patient declined No - Patient declined    Current Medications (verified) Outpatient Encounter Medications as of 08/28/2023  Medication Sig   atorvastatin (LIPITOR) 20 MG tablet Take 1 tablet (20 mg total) by mouth daily.   mirtazapine (REMERON) 15 MG tablet TAKE ONE TABLET BY MOUTH AT BEDTIME   Cranberry-Vitamin C 250-60 MG CAPS Take 1 capsule by mouth at bedtime. (Patient not taking: Reported on 08/28/2023)   No facility-administered encounter medications on file as of 08/28/2023.    Allergies (verified) Ciprofloxacin and Ivp dye [iodinated contrast media]   History: Past Medical History:   Diagnosis Date   Breast cancer (HCC)    Past Surgical History:  Procedure Laterality Date   BREAST LUMPECTOMY     CHOLECYSTECTOMY     HERNIA REPAIR     OTHER SURGICAL HISTORY     Colon Surgery   Family History  Problem Relation Age of Onset   Cancer Sister    Social History   Socioeconomic History   Marital status: Widowed    Spouse name: Not on file   Number of children: Not on file   Years of education: Not on file   Highest education level: Not on file  Occupational History   Not on file  Tobacco Use   Smoking status: Never   Smokeless tobacco: Never  Vaping Use   Vaping status: Never Used  Substance and Sexual Activity   Alcohol use: No    Alcohol/week: 0.0 standard drinks of alcohol   Drug use: No   Sexual activity: Not on file  Other Topics Concern   Not on file  Social History Narrative   Not on file   Social Drivers of Health   Financial Resource Strain: Low Risk  (08/28/2023)   Overall Financial Resource Strain (CARDIA)    Difficulty of Paying Living Expenses: Not hard at all  Food Insecurity: No Food Insecurity (08/28/2023)   Hunger Vital Sign    Worried About Running Out of Food in the Last Year: Never true    Ran Out of Food in the Last Year: Never true  Transportation Needs: No Transportation Needs (  08/28/2023)   PRAPARE - Administrator, Civil Service (Medical): No    Lack of Transportation (Non-Medical): No  Physical Activity: Inactive (08/28/2023)   Exercise Vital Sign    Days of Exercise per Week: 0 days    Minutes of Exercise per Session: 0 min  Stress: No Stress Concern Present (08/28/2023)   [employer] of Occupational Health - Occupational Stress Questionnaire    Feeling of Stress : Not at all  Social Connections: Moderately Isolated (08/28/2023)   Social Connection and Isolation Panel [NHANES]    Frequency of Communication with Friends and Family: More than three times a week    Frequency of Social Gatherings with  Friends and Family: Never    Attends Religious Services: Never    Database administrator or Organizations: Yes    Attends Banker Meetings: Never    Marital Status: Widowed    Tobacco Counseling Counseling given: Not Answered   Clinical Intake:  Pre-visit preparation completed: Yes  Pain : No/denies pain Pain Score: 0-No pain     BMI - recorded: 20.28 Nutritional Status: BMI of 19-24  Normal Nutritional Risks: None Diabetes: No  How often do you need to have someone help you when you read instructions, pamphlets, or other written materials from your doctor or pharmacy?: 1 - Never What is the last grade level you completed in school?: 10TH GRADE  Interpreter Needed?: No  Information entered by :: Elajah Kunsman N. Farrin Shadle, LPN.   Activities of Daily Living    08/28/2023    1:08 PM  In your present state of health, do you have any difficulty performing the following activities:  Hearing? 0  Vision? 0  Difficulty concentrating or making decisions? 0  Walking or climbing stairs? 0  Dressing or bathing? 0  Doing errands, shopping? 0  Preparing Food and eating ? N  Using the Toilet? N  In the past six months, have you accidently leaked urine? N  Do you have problems with loss of bowel control? N  Managing your Medications? N  Managing your Finances? N  Housekeeping or managing your Housekeeping? N    Patient Care Team: Billie Lade, MD as PCP - General (Internal Medicine)  Indicate any recent Medical Services you may have received from other than [facility] providers in the past year (date may be approximate).     Assessment:   This is a routine wellness examination for [facility]  Hearing/Vision screen Hearing Screening - Comments:: Denies hearing difficulties.   Vision Screening - Comments:: Wears rx glasses - not up to date with routine eye exams.    Goals Addressed             This Visit's Progress    Client understands the importance of  follow-up with providers by attending scheduled visits        Depression Screen    08/28/2023    1:07 PM 04/21/2023    2:54 PM 12/22/2022    2:07 PM 12/01/2022    2:28 PM 11/18/2022    1:30 PM 10/21/2022    2:25 PM 09/19/2022    1:54 PM  PHQ 2/9 Scores  PHQ - 2 Score 0 0 0 0 0 0 0  PHQ- 9 Score 0 7  4 8 6 3     Fall Risk    08/28/2023    1:06 PM 12/22/2022    2:07 PM 12/01/2022    2:28 PM 11/18/2022    1:30 PM 10/21/2022  2:24 PM  Fall Risk   Falls in the past year? 0 1 1 1 1   Number falls in past yr: 0 1 1 1 1   Injury with Fall? 0 1 1 1  0  Risk for fall due to : No Fall Risks History of fall(s)  History of fall(s);Impaired balance/gait Impaired balance/gait;Impaired mobility  Follow up Falls prevention discussed Falls evaluation completed  Falls evaluation completed Falls evaluation completed    MEDICARE RISK AT HOME: Medicare Risk at Home Any stairs in or around the home?: No If so, are there any without handrails?: No Home free of loose throw rugs in walkways, pet beds, electrical cords, etc?: Yes Adequate lighting in your home to reduce risk of falls?: Yes Life alert?: No Use of a cane, walker or w/c?: No Grab bars in the bathroom?: Yes Shower chair or bench in shower?: Yes Elevated toilet seat or a handicapped toilet?: Yes  TIMED UP AND GO:  Was the test performed?  No    Cognitive Function:    08/28/2023    1:07 PM  MMSE - Mini Mental State Exam  Not completed: Unable to complete        08/28/2023    1:07 PM 08/01/2022   11:51 AM  6CIT Screen  What Year? 0 points 0 points  What month? 0 points 0 points  What time? 0 points 0 points  Count back from 20 0 points 0 points  Months in reverse 0 points 4 points  Repeat phrase 0 points 4 points  Total Score 0 points 8 points    Immunizations Immunization History  Administered Date(s) Administered   Moderna Sars-Covid-2 Vaccination 11/01/2019, 11/29/2019    TDAP status: Never done  Flu Vaccine  status: Declined, Education has been provided regarding the importance of this vaccine but patient still declined. Advised may receive this vaccine at local pharmacy or Health Dept. Aware to provide a copy of the vaccination record if obtained from local pharmacy or Health Dept. Verbalized acceptance and understanding.  Pneumococcal vaccine status: Declined,  Education has been provided regarding the importance of this vaccine but patient still declined. Advised may receive this vaccine at local pharmacy or Health Dept. Aware to provide a copy of the vaccination record if obtained from local pharmacy or Health Dept. Verbalized acceptance and understanding.   Covid-19 vaccine status: Declined, Education has been provided regarding the importance of this vaccine but patient still declined. Advised may receive this vaccine at local pharmacy or Health Dept.or vaccine clinic. Aware to provide a copy of the vaccination record if obtained from local pharmacy or Health Dept. Verbalized acceptance and understanding.  Qualifies for Shingles Vaccine? Yes   Zostavax completed No   Shingrix Completed?: No.    Education has been provided regarding the importance of this vaccine. Patient has been advised to call insurance company to determine out of pocket expense if they have not yet received this vaccine. Advised may also receive vaccine at local pharmacy or Health Dept. Verbalized acceptance and understanding.  Screening Tests Health Maintenance  Topic Date Due   DTaP/Tdap/Td (1 - Tdap) Never done   Zoster Vaccines- Shingrix (1 of 2) Never done   Pneumonia Vaccine 74+ Years old (1 of 1 - PCV) Never done   DEXA SCAN  Never done   INFLUENZA VACCINE  Never done   COVID-19 Vaccine (3 - 2024-25 season) 05/07/2023   Medicare Annual Wellness (AWV)  08/27/2024   HPV VACCINES  Aged Out  Health Maintenance  Health Maintenance Due  Topic Date Due   DTaP/Tdap/Td (1 - Tdap) Never done   Zoster Vaccines-  Shingrix (1 of 2) Never done   Pneumonia Vaccine 70+ Years old (1 of 1 - PCV) Never done   DEXA SCAN  Never done   INFLUENZA VACCINE  Never done   COVID-19 Vaccine (3 - 2024-25 season) 05/07/2023    Colorectal cancer screening: No longer required.   Mammogram status: No longer required due to age.  Bone density status: never done  Lung Cancer Screening: (Low Dose CT Chest recommended if Age 70-80 years, 20 pack-year currently smoking OR have quit w/in 15years.) does not qualify.   Lung Cancer Screening Referral: no  Additional Screening:  Hepatitis C Screening: does not qualify  Vision Screening: Recommended annual ophthalmology exams for early detection of glaucoma and other disorders of the eye. Is the patient up to date with their annual eye exam?  No  Who is the provider or what is the name of the office in which the patient attends annual eye exams? No If pt is not established with a provider, would they like to be referred to a provider to establish care? No .   Dental Screening: Recommended annual dental exams for proper oral hygiene  Community Resource Referral / Chronic Care Management: CRR required this visit?  No   CCM required this visit?  No     Plan:     I have personally reviewed and noted the following in the patient's chart:   Medical and social history Use of alcohol, tobacco or illicit drugs  Current medications and supplements including opioid prescriptions. Patient is not currently taking opioid prescriptions. Functional ability and status Nutritional status Physical activity Advanced directives List of other physicians Hospitalizations, surgeries, and ER visits in previous 12 months Vitals Screenings to include cognitive, depression, and falls Referrals and appointments  In addition, I have reviewed and discussed with patient certain preventive protocols, quality metrics, and best practice recommendations. A written personalized care plan for  preventive services as well as general preventive health recommendations were provided to patient.     Mickeal Needy, LPN   [id number]   After Visit Summary: (Declined) Due to this being a telephonic visit, with patients personalized plan was offered to patient but patient Declined AVS at this time   Nurse Notes: None at this time.

## 2023-09-03 ENCOUNTER — Other Ambulatory Visit: Payer: Self-pay | Admitting: Internal Medicine

## 2023-10-23 ENCOUNTER — Ambulatory Visit (INDEPENDENT_AMBULATORY_CARE_PROVIDER_SITE_OTHER): Payer: Medicare HMO | Admitting: Internal Medicine

## 2023-10-23 ENCOUNTER — Encounter: Payer: Self-pay | Admitting: Internal Medicine

## 2023-10-23 VITALS — BP 127/71 | HR 93 | Ht 64.5 in | Wt 124.0 lb

## 2023-10-23 DIAGNOSIS — R630 Anorexia: Secondary | ICD-10-CM | POA: Diagnosis not present

## 2023-10-23 DIAGNOSIS — E042 Nontoxic multinodular goiter: Secondary | ICD-10-CM | POA: Diagnosis not present

## 2023-10-23 DIAGNOSIS — E559 Vitamin D deficiency, unspecified: Secondary | ICD-10-CM | POA: Diagnosis not present

## 2023-10-23 DIAGNOSIS — G4700 Insomnia, unspecified: Secondary | ICD-10-CM | POA: Diagnosis not present

## 2023-10-23 DIAGNOSIS — E785 Hyperlipidemia, unspecified: Secondary | ICD-10-CM

## 2023-10-23 DIAGNOSIS — D539 Nutritional anemia, unspecified: Secondary | ICD-10-CM | POA: Diagnosis not present

## 2023-10-23 DIAGNOSIS — Z13820 Encounter for screening for osteoporosis: Secondary | ICD-10-CM | POA: Diagnosis not present

## 2023-10-23 NOTE — Assessment & Plan Note (Signed)
Continues to struggle with insomnia but states that symptoms are somewhat better.  She is using melatonin as needed.

## 2023-10-23 NOTE — Assessment & Plan Note (Signed)
History of MNG.  Previously followed by endocrinology.  Remains asymptomatic.  Repeat thyroid studies ordered today.

## 2023-10-23 NOTE — Assessment & Plan Note (Signed)
DEXA previously ordered but not completed.  Patient provided with contact information for radiology to call and schedule this study.

## 2023-10-23 NOTE — Progress Notes (Signed)
Established Patient Office Visit  Subjective   Patient ID: Meghan Oconnell, female    DOB: 04/23/1931  Age: 89 y.o. MRN: [id number]  Chief Complaint  Patient presents with   Care Management    Six month follow up    Meghan Oconnell returns to care today for routine follow-up.  She was last evaluated by me in August 2024.  No medication changes were made at that time and 73-month follow-up was arranged.  There have been no acute interval events. Meghan Oconnell reports feeling well today.  She is asymptomatic and has no acute concerns to discuss.  Past Medical History:  Diagnosis Date   Acute cystitis with hematuria 09/19/2022   Breast cancer (HCC)    Right rib fracture 11/21/2022   Radiologic confirmation on 11/18/2022, 9th rib, minimally displaced, injury on 11/14/2022     Urinary tract infection 07/27/2022   Past Surgical History:  Procedure Laterality Date   BREAST LUMPECTOMY     CHOLECYSTECTOMY     HERNIA REPAIR     OTHER SURGICAL HISTORY     Colon Surgery   Social History   Tobacco Use   Smoking status: Never   Smokeless tobacco: Never  Vaping Use   Vaping status: Never Used  Substance Use Topics   Alcohol use: No    Alcohol/week: 0.0 standard drinks of alcohol   Drug use: No   Family History  Problem Relation Age of Onset   Cancer Sister    Allergies  Allergen Reactions   Ciprofloxacin     nausea   Ivp Dye [Iodinated Contrast Media] Rash    Blue Dye   Review of Systems  Constitutional:  Negative for chills and fever.  HENT:  Negative for sore throat.   Respiratory:  Negative for cough and shortness of breath.   Cardiovascular:  Negative for chest pain, palpitations and leg swelling.  Gastrointestinal:  Negative for abdominal pain, blood in stool, constipation, diarrhea, nausea and vomiting.  Genitourinary:  Negative for dysuria and hematuria.  Musculoskeletal:  Negative for myalgias.  Skin:  Negative for itching and rash.  Neurological:  Negative for  dizziness and headaches.  Psychiatric/Behavioral:  Negative for depression and suicidal ideas.      Objective:     BP 127/71 (BP Location: Left Arm, Patient Position: Sitting, Cuff Size: Normal)   Pulse 93   Ht 5' 4.5" (1.638 m)   Wt 124 lb (56.2 kg)   SpO2 94%   BMI 20.96 kg/m  BP Readings from Last 3 Encounters:  10/23/23 127/71  04/21/23 108/69  03/19/23 (!) 143/81   Physical Exam Vitals reviewed.  Constitutional:      General: She is not in acute distress.    Appearance: Normal appearance. She is not toxic-appearing.  HENT:     Head: Normocephalic and atraumatic.     Right Ear: External ear normal.     Left Ear: External ear normal.     Nose: Nose normal. No congestion or rhinorrhea.     Mouth/Throat:     Mouth: Mucous membranes are moist.     Pharynx: Oropharynx is clear. No oropharyngeal exudate or posterior oropharyngeal erythema.  Eyes:     General: No scleral icterus.    Extraocular Movements: Extraocular movements intact.     Conjunctiva/sclera: Conjunctivae normal.     Pupils: Pupils are equal, round, and reactive to light.  Cardiovascular:     Rate and Rhythm: Normal rate and regular rhythm.     Pulses: Normal  pulses.     Heart sounds: Normal heart sounds. No murmur heard.    No friction rub. No gallop.  Pulmonary:     Effort: Pulmonary effort is normal.     Breath sounds: Normal breath sounds. No wheezing, rhonchi or rales.  Abdominal:     General: Abdomen is flat. Bowel sounds are normal. There is no distension.     Palpations: Abdomen is soft.     Tenderness: There is no abdominal tenderness. There is no right CVA tenderness, left CVA tenderness, guarding or rebound.  Musculoskeletal:        General: No swelling. Normal range of motion.     Cervical back: Normal range of motion.     Right lower leg: No edema.     Left lower leg: No edema.  Lymphadenopathy:     Cervical: No cervical adenopathy.  Skin:    General: Skin is warm and dry.      Capillary Refill: Capillary refill takes less than 2 seconds.     Coloration: Skin is not jaundiced.  Neurological:     General: No focal deficit present.     Mental Status: She is alert and oriented to person, place, and time.  Psychiatric:        Mood and Affect: Mood normal.        Behavior: Behavior normal.   Last CBC Lab Results  Component Value Date   WBC 7.3 07/20/2022   HGB 13.6 07/20/2022   HCT 42.5 07/20/2022   MCV 93.6 07/20/2022   MCH 30.0 07/20/2022   RDW 13.1 07/20/2022   PLT 200 07/20/2022   Last metabolic panel Lab Results  Component Value Date   GLUCOSE 116 (H) 07/20/2022   NA 139 07/20/2022   K 4.5 07/20/2022   CL 105 07/20/2022   CO2 26 07/20/2022   BUN 16 07/20/2022   CREATININE 1.07 (H) 07/20/2022   GFRNONAA 50 (L) 07/20/2022   CALCIUM 8.9 07/20/2022   PROT 7.0 07/20/2022   ALBUMIN 3.7 07/20/2022   BILITOT 0.7 07/20/2022   ALKPHOS 69 07/20/2022   AST 19 07/20/2022   ALT 12 07/20/2022   ANIONGAP 8 07/20/2022   Last thyroid functions Lab Results  Component Value Date   TSH 0.262 (L) 07/21/2022   Last vitamin D Lab Results  Component Value Date   VD25OH 28.6 (L) 07/21/2022   Last vitamin B12 and Folate Lab Results  Component Value Date   VITAMINB12 342 07/21/2022   FOLATE 5.3 07/21/2022     Assessment & Plan:   Problem List Items Addressed This Visit       Multinodular goiter - Primary   History of MNG.  Previously followed by endocrinology.  Remains asymptomatic.  Repeat thyroid studies ordered today.      HLD (hyperlipidemia)   Remains on atorvastatin 20 mg daily.  No recent lipid panel on file.  Repeat lipid panel ordered today.  Consider discontinuation of therapy pending results.  No prior history of cardiovascular disease.      Insomnia   Continues to struggle with insomnia but states that symptoms are somewhat better.  She is using melatonin as needed.      Loss of appetite   Weight stable, increased by 3 pounds from  her last appointment.  She is eating 3 meals daily.  Remains on Remeron.      Osteoporosis screening   DEXA previously ordered but not completed.  Patient provided with contact information for radiology to call and  schedule this study.      Return in about 6 months (around 04/21/2024).   Billie Lade, MD

## 2023-10-23 NOTE — Assessment & Plan Note (Signed)
Remains on atorvastatin 20 mg daily.  No recent lipid panel on file.  Repeat lipid panel ordered today.  Consider discontinuation of therapy pending results.  No prior history of cardiovascular disease.

## 2023-10-23 NOTE — Assessment & Plan Note (Signed)
Weight stable, increased by 3 pounds from her last appointment.  She is eating 3 meals daily.  Remains on Remeron.

## 2023-10-23 NOTE — Patient Instructions (Signed)
 It was a pleasure to see you today.  Thank you for giving [place] the opportunity to be involved in your care.  Below is a brief recap of your visit and next steps.  We will plan to see you again in 6 months.  Summary No medication changes today Repeat labs ordered Follow up in 6 months

## 2023-10-24 LAB — CBC WITH DIFFERENTIAL/PLATELET
Basophils Absolute: 0 10*3/uL (ref 0.0–0.2)
Basos: 1 %
EOS (ABSOLUTE): 0.2 10*3/uL (ref 0.0–0.4)
Eos: 2 %
Hematocrit: 40.9 % (ref 34.0–46.6)
Hemoglobin: 13.4 g/dL (ref 11.1–15.9)
Immature Grans (Abs): 0 10*3/uL (ref 0.0–0.1)
Immature Granulocytes: 0 %
Lymphocytes Absolute: 1.7 10*3/uL (ref 0.7–3.1)
Lymphs: 22 %
MCH: 30.5 pg (ref 26.6–33.0)
MCHC: 32.8 g/dL (ref 31.5–35.7)
MCV: 93 fL (ref 79–97)
Monocytes Absolute: 0.7 10*3/uL (ref 0.1–0.9)
Monocytes: 9 %
Neutrophils Absolute: 5.1 10*3/uL (ref 1.4–7.0)
Neutrophils: 66 %
Platelets: 159 10*3/uL (ref 150–450)
RBC: 4.4 x10E6/uL (ref 3.77–5.28)
RDW: 12.8 % (ref 11.7–15.4)
WBC: 7.7 10*3/uL (ref 3.4–10.8)

## 2023-10-24 LAB — VITAMIN D 25 HYDROXY (VIT D DEFICIENCY, FRACTURES): Vit D, 25-Hydroxy: 30.1 ng/mL (ref 30.0–100.0)

## 2023-10-24 LAB — LIPID PANEL
Chol/HDL Ratio: 3.2 {ratio} (ref 0.0–4.4)
Cholesterol, Total: 145 mg/dL (ref 100–199)
HDL: 46 mg/dL (ref >39)
LDL Chol Calc (NIH): 63 mg/dL (ref 0–99)
Triglycerides: 220 mg/dL — ABNORMAL HIGH (ref 0–149)
VLDL Cholesterol Cal: 36 mg/dL (ref 5–40)

## 2023-10-24 LAB — CMP14+EGFR
ALT: 9 [IU]/L (ref 0–32)
AST: 13 [IU]/L (ref 0–40)
Albumin: 4.2 g/dL (ref 3.6–4.6)
Alkaline Phosphatase: 82 [IU]/L (ref 44–121)
BUN/Creatinine Ratio: 22 (ref 12–28)
BUN: 18 mg/dL (ref 10–36)
Bilirubin Total: 0.2 mg/dL (ref 0.0–1.2)
CO2: 24 mmol/L (ref 20–29)
Calcium: 8.8 mg/dL (ref 8.7–10.3)
Chloride: 103 mmol/L (ref 96–106)
Creatinine, Ser: 0.83 mg/dL (ref 0.57–1.00)
Globulin, Total: 2.2 g/dL (ref 1.5–4.5)
Glucose: 134 mg/dL — ABNORMAL HIGH (ref 70–99)
Potassium: 4 mmol/L (ref 3.5–5.2)
Sodium: 141 mmol/L (ref 134–144)
Total Protein: 6.4 g/dL (ref 6.0–8.5)
eGFR: 67 mL/min/{1.73_m2} (ref >59)

## 2023-10-24 LAB — TSH+FREE T4
Free T4: 1.2 ng/dL (ref 0.82–1.77)
TSH: 0.237 u[IU]/mL — ABNORMAL LOW (ref 0.450–4.500)

## 2023-10-24 LAB — B12 AND FOLATE PANEL
Folate: 8.4 ng/mL (ref >3.0)
Vitamin B-12: 409 pg/mL (ref 232–1245)

## 2023-10-30 ENCOUNTER — Other Ambulatory Visit (HOSPITAL_COMMUNITY): Payer: Medicare HMO

## 2023-11-06 ENCOUNTER — Other Ambulatory Visit (HOSPITAL_COMMUNITY): Payer: Medicare HMO

## 2023-11-08 ENCOUNTER — Other Ambulatory Visit (HOSPITAL_COMMUNITY): Payer: Medicare HMO

## 2023-11-29 ENCOUNTER — Other Ambulatory Visit: Payer: Self-pay | Admitting: Internal Medicine

## 2024-01-01 ENCOUNTER — Other Ambulatory Visit: Payer: Self-pay | Admitting: Internal Medicine

## 2024-01-04 DIAGNOSIS — M545 Low back pain, unspecified: Secondary | ICD-10-CM | POA: Diagnosis not present

## 2024-01-17 ENCOUNTER — Ambulatory Visit
Admission: EM | Admit: 2024-01-17 | Discharge: 2024-01-17 | Disposition: A | Discharge status: Home / Self Care | Attending: Nurse Practitioner | Admitting: Nurse Practitioner

## 2024-01-17 DIAGNOSIS — N3000 Acute cystitis without hematuria: Secondary | ICD-10-CM | POA: Diagnosis not present

## 2024-01-17 LAB — POCT URINALYSIS DIP (MANUAL ENTRY)
Bilirubin, UA: NEGATIVE
Glucose, UA: 100 mg/dL — AB
Ketones, POC UA: NEGATIVE mg/dL
Nitrite, UA: POSITIVE — AB
Protein Ur, POC: 30 mg/dL — AB
Spec Grav, UA: 1.02
Urobilinogen, UA: 1 U/dL
pH, UA: 5.5

## 2024-01-17 MED ORDER — SULFAMETHOXAZOLE-TRIMETHOPRIM 800-160 MG PO TABS: 1.0000 | ORAL_TABLET | Freq: Two times a day (BID) | ORAL | 0 refills | Status: AC

## 2024-01-17 NOTE — ED Triage Notes (Signed)
 Pt reports UTI sx's burning with urination x 3 days

## 2024-01-17 NOTE — Discharge Instructions (Signed)
 The urinalysis shows you have a urinary tract infection.  A urine culture has been ordered to ensure you are being treated with the appropriate antibiotic.  If the medication needs to be changed, you will be contacted. Take medication as prescribed. Increase fluids.  Try to drink at least 5-7 eight ounce glasses of water daily. May take over-the-counter Tylenol  as needed for pain, fever, or general discomfort. Avoid caffeine such as tea, soda, or coffee while symptoms persist. Make sure you are urinating at least every 2 hours while symptoms persist. Go to the emergency department immediately if you experience worsening urinary symptoms with new symptoms of fever, chills, or other concerns. Follow-up with your primary care physician if symptoms fail to improve with this treatment. Follow-up as needed.

## 2024-01-17 NOTE — ED Provider Notes (Signed)
 [facility]    CSN: [id number] Arrival date & time: 01/17/24  1400      History   Chief Complaint No chief complaint on file.   HPI Meghan Oconnell is a 89 y.o. female.   The history is provided by the patient and a relative (Daughter).   Patient presents with a 3-day history of burning with urination.  Patient denies fever, chills, hematuria, urinary urgency, hesitancy, decreased urine stream, flank pain, low back pain, or vaginal symptoms.  Daughter reports patient's last UTI was last fall.  States patient does have a history of reoccurring urinary tract infections.  Denies prior history of kidney infection or kidney stones. Past Medical History:  Diagnosis Date   Acute cystitis with hematuria 09/19/2022   Breast cancer (HCC)    Right rib fracture 11/21/2022   Radiologic confirmation on 11/18/2022, 9th rib, minimally displaced, injury on 11/14/2022     Urinary tract infection 07/27/2022    Patient Active Problem List   Diagnosis Date Noted   Loss of appetite 04/21/2023   Osteoporosis screening 04/21/2023   Fall 11/21/2022   History of breast cancer 07/27/2022   HLD (hyperlipidemia) 07/27/2022   Insomnia 07/27/2022   Encounter for general adult medical examination with abnormal findings 07/27/2022   Multinodular goiter 06/15/2015    Past Surgical History:  Procedure Laterality Date   BREAST LUMPECTOMY     CHOLECYSTECTOMY     HERNIA REPAIR     OTHER SURGICAL HISTORY     Colon Surgery    OB History   No obstetric history on file.      Home Medications    Prior to Admission medications   Medication Sig Start Date End Date Taking? Authorizing Provider  sulfamethoxazole -trimethoprim  (BACTRIM  DS) 800-160 MG tablet Take 1 tablet by mouth 2 (two) times daily for 7 days. 01/17/24 01/24/24 Yes Leath-Warren, Belen Bowers, NP  atorvastatin  (LIPITOR) 20 MG tablet Take 1 tablet (20 mg total) by mouth daily. 01/01/24   Tobi Fortes, MD  Cranberry-Vitamin C  250-60 MG CAPS Take 1 capsule by mouth at bedtime. Patient not taking: Reported on 08/28/2023    [provider]  mirtazapine  (REMERON ) 15 MG tablet TAKE ONE TABLET BY MOUTH AT BEDTIME 11/29/23   Tobi Fortes, MD    Family History Family History  Problem Relation Age of Onset   Cancer Sister     Social History Social History   Tobacco Use   Smoking status: Never   Smokeless tobacco: Never  Vaping Use   Vaping status: Never Used  Substance Use Topics   Alcohol use: No    Alcohol/week: 0.0 standard drinks of alcohol   Drug use: No     Allergies   Ciprofloxacin and Ivp dye [iodinated contrast media]   Review of Systems Review of Systems Per HPI  Physical Exam Triage Vital Signs ED Triage Vitals  Encounter Vitals Group     BP 01/17/24 1454 (!) 151/92     Systolic BP Percentile --      Diastolic BP Percentile --      Pulse Rate 01/17/24 1454 82     Resp 01/17/24 1454 16     Temp 01/17/24 1454 (!) 97.5 F (36.4 C)     Temp Source 01/17/24 1454 Oral     SpO2 01/17/24 1454 94 %     Weight --      Height --      Head Circumference --      Peak  Flow --      Pain Score 01/17/24 1456 0     Pain Loc --      Pain Education --      Exclude from Growth Chart --    No data found.  Updated Vital Signs BP (!) 151/92 (BP Location: Right Arm)   Pulse 82   Temp (!) 97.5 F (36.4 C) (Oral)   Resp 16   SpO2 94%   Visual Acuity Right Eye Distance:   Left Eye Distance:   Bilateral Distance:    Right Eye Near:   Left Eye Near:    Bilateral Near:     Physical Exam Vitals and nursing note reviewed.  Constitutional:      Appearance: Normal appearance.  HENT:     Head: Normocephalic.  Eyes:     Extraocular Movements: Extraocular movements intact.     Conjunctiva/sclera: Conjunctivae normal.     Pupils: Pupils are equal, round, and reactive to light.  Cardiovascular:     Rate and Rhythm: Normal rate and regular rhythm.     Pulses: Normal pulses.      Heart sounds: Normal heart sounds.  Pulmonary:     Effort: Pulmonary effort is normal. No respiratory distress.     Breath sounds: Normal breath sounds. No stridor. No wheezing, rhonchi or rales.  Chest:     Chest wall: No tenderness.  Abdominal:     General: Bowel sounds are normal.     Palpations: Abdomen is soft.     Tenderness: There is no abdominal tenderness. There is no right CVA tenderness or left CVA tenderness.  Musculoskeletal:     Cervical back: Normal range of motion.  Skin:    General: Skin is warm and dry.  Neurological:     General: No focal deficit present.     Mental Status: She is alert and oriented to person, place, and time.  Psychiatric:        Mood and Affect: Mood normal.        Behavior: Behavior normal.      UC Treatments / Results  Labs (all labs ordered are listed, but only abnormal results are displayed) Labs Reviewed  POCT URINALYSIS DIP (MANUAL ENTRY) - Abnormal; Notable for the following components:      Result Value   Color, UA orange (*)    Clarity, UA turbid (*)    Glucose, UA =100 (*)    Blood, UA small (*)    Protein Ur, POC =30 (*)    Nitrite, UA Positive (*)    Leukocytes, UA Large (3+) (*)    All other components within normal limits  URINE CULTURE    EKG   Radiology No results found.  Procedures Procedures (including critical care time)  Medications Ordered in UC Medications - No data to display  Initial Impression / Assessment and Plan / UC Course  I have reviewed the triage vital signs and the nursing notes.  Pertinent labs & imaging results that were available during my care of the patient were reviewed by me and considered in my medical decision making (see chart for details).  Urinalysis is positive for nitrites and leukocytes along with blood and protein, consistent for acute cystitis without hematuria.  Urine culture is pending.  Will treat empirically with Bactrim  DS 800/160 mg tablets twice daily for the next  7 days.  (Most recent creatinine was 0.83/GFR 67).  Supportive care recommendations were provided and discussed with patient and her daughter to  include fluids, over-the-counter Tylenol , developing a toileting schedule, and avoiding caffeine.  Discussed strict ER follow-up precautions.  Patient advised to follow-up with PCP if symptoms fail to improve with this treatment.  Patient and daughter were in agreement with this plan of care and verbalized understanding.  All questions were answered.  Patient stable for discharge.   Final Clinical Impressions(s) / UC Diagnoses   Final diagnoses:  Acute cystitis without hematuria     Discharge Instructions      The urinalysis shows you have a urinary tract infection.  A urine culture has been ordered to ensure you are being treated with the appropriate antibiotic.  If the medication needs to be changed, you will be contacted. Take medication as prescribed. Increase fluids.  Try to drink at least 5-7 eight ounce glasses of water daily. May take over-the-counter Tylenol  as needed for pain, fever, or general discomfort. Avoid caffeine such as tea, soda, or coffee while symptoms persist. Make sure you are urinating at least every 2 hours while symptoms persist. Go to the emergency department immediately if you experience worsening urinary symptoms with new symptoms of fever, chills, or other concerns. Follow-up with your primary care physician if symptoms fail to improve with this treatment. Follow-up as needed.    ED Prescriptions     Medication Sig Dispense Auth. Provider   sulfamethoxazole -trimethoprim  (BACTRIM  DS) 800-160 MG tablet Take 1 tablet by mouth 2 (two) times daily for 7 days. 14 tablet Leath-Warren, Belen Bowers, NP      PDMP not reviewed this encounter.   Hardy Lia, NP 01/17/24 1526

## 2024-01-19 ENCOUNTER — Ambulatory Visit (HOSPITAL_COMMUNITY): Payer: Self-pay

## 2024-01-19 LAB — URINE CULTURE: Culture: >=100000 — AB

## 2024-01-22 ENCOUNTER — Telehealth: Payer: Self-pay

## 2024-01-22 ENCOUNTER — Telehealth: Payer: Self-pay | Admitting: Family Medicine

## 2024-01-22 MED ORDER — CEPHALEXIN 500 MG PO CAPS: 500.0000 mg | ORAL_CAPSULE | Freq: Two times a day (BID) | ORAL | 0 refills | Status: DC

## 2024-01-22 NOTE — Telephone Encounter (Signed)
 Pt daughter Vernestine Gondola called stating the bactrim  that patient was put on for her uti has causing her to dry heave and be sick on her stomach. Pt stopped taking the bactrim  on 5/16. The daughter stated keflex  worked for her previous UTI. After speaking with the provider and the chart was reviewed pt will be sent in Keflex  to [facility]. Daughter verbalized understanding

## 2024-01-22 NOTE — Telephone Encounter (Signed)
 Patient calling, Bactrim  is making her have GI upset and is requesting to switch to Keflex  which she is known to tolerate.  Urine culture reviewed, Keflex  prescribed

## 2024-02-22 DIAGNOSIS — M545 Low back pain, unspecified: Secondary | ICD-10-CM | POA: Diagnosis not present

## 2024-02-22 DIAGNOSIS — M5416 Radiculopathy, lumbar region: Secondary | ICD-10-CM | POA: Diagnosis not present

## 2024-03-10 ENCOUNTER — Other Ambulatory Visit: Payer: Self-pay | Admitting: Internal Medicine

## 2024-03-26 DIAGNOSIS — M545 Low back pain, unspecified: Secondary | ICD-10-CM | POA: Diagnosis not present

## 2024-04-22 ENCOUNTER — Ambulatory Visit

## 2024-04-22 ENCOUNTER — Ambulatory Visit: Payer: Medicare HMO | Admitting: Internal Medicine

## 2024-04-22 VITALS — BP 149/78 | HR 80 | Ht 64.0 in | Wt 118.1 lb

## 2024-04-22 DIAGNOSIS — E785 Hyperlipidemia, unspecified: Secondary | ICD-10-CM | POA: Diagnosis not present

## 2024-04-22 DIAGNOSIS — R300 Dysuria: Secondary | ICD-10-CM | POA: Diagnosis not present

## 2024-04-22 DIAGNOSIS — G4700 Insomnia, unspecified: Secondary | ICD-10-CM | POA: Diagnosis not present

## 2024-04-22 DIAGNOSIS — E042 Nontoxic multinodular goiter: Secondary | ICD-10-CM

## 2024-04-22 MED ORDER — SULFAMETHOXAZOLE-TRIMETHOPRIM 800-160 MG PO TABS: 1.0000 | ORAL_TABLET | Freq: Two times a day (BID) | ORAL | 0 refills | Status: AC

## 2024-04-22 MED ORDER — ATORVASTATIN CALCIUM 20 MG PO TABS: 20.0000 mg | ORAL_TABLET | Freq: Every day | ORAL | 3 refills | Status: AC

## 2024-04-22 MED ORDER — MIRTAZAPINE 30 MG PO TABS: 30.0000 mg | ORAL_TABLET | Freq: Every day | ORAL | 1 refills | Status: AC

## 2024-04-22 NOTE — Patient Instructions (Signed)
 Recommend trying an OTC Magnesium supplement to help with sleep.   Magnesium glycinate or thionate. Ask pharmacist which they recommend.   Recommend coming in to leave a urine culture if symptoms don't improve with antibiotic.

## 2024-04-22 NOTE — Progress Notes (Unsigned)
 Established Patient Office Visit  Subjective   Patient ID: Meghan Oconnell, female    DOB: 1930/11/12  Age: 89 y.o. MRN: [id number]  Chief Complaint  Patient presents with   Medical Management of Chronic Issues    6 month follow up, Pt also has a complaint of burning when Urinating thinks she may have a UTI    HPI  Patient Active Problem List   Diagnosis Date Noted   Loss of appetite 04/21/2023   Osteoporosis screening 04/21/2023   Fall 11/21/2022   History of breast cancer 07/27/2022   HLD (hyperlipidemia) 07/27/2022   Insomnia 07/27/2022   Encounter for general adult medical examination with abnormal findings 07/27/2022   Multinodular goiter 06/15/2015      ROS    Objective:     BP (!) 149/78   Pulse 80   Ht 5' 4 (1.626 m)   Wt 118 lb 1.9 oz (53.6 kg)   SpO2 96%   BMI 20.28 kg/m  BP Readings from Last 3 Encounters:  04/22/24 (!) 149/78  01/17/24 (!) 151/92  10/23/23 127/71   Wt Readings from Last 3 Encounters:  04/22/24 118 lb 1.9 oz (53.6 kg)  10/23/23 124 lb (56.2 kg)  08/28/23 120 lb (54.4 kg)      Physical Exam Vitals and nursing note reviewed.  Constitutional:      Appearance: Normal appearance.  HENT:     Head: Normocephalic.  Eyes:     Extraocular Movements: Extraocular movements intact.     Pupils: Pupils are equal, round, and reactive to light.  Cardiovascular:     Rate and Rhythm: Normal rate and regular rhythm.  Pulmonary:     Effort: Pulmonary effort is normal.     Breath sounds: Normal breath sounds.  Musculoskeletal:     Cervical back: Normal range of motion and neck supple.  Neurological:     Mental Status: She is alert and oriented to person, place, and time.  Psychiatric:        Mood and Affect: Mood normal.        Thought Content: Thought content normal.      No results found for any visits on 04/22/24.  Last CBC Lab Results  Component Value Date   WBC 7.7 10/23/2023   HGB 13.4 10/23/2023   HCT 40.9  10/23/2023   MCV 93 10/23/2023   MCH 30.5 10/23/2023   RDW 12.8 10/23/2023   PLT 159 10/23/2023   Last metabolic panel Lab Results  Component Value Date   GLUCOSE 134 (H) 10/23/2023   NA 141 10/23/2023   K 4.0 10/23/2023   CL 103 10/23/2023   CO2 24 10/23/2023   BUN 18 10/23/2023   CREATININE 0.83 10/23/2023   EGFR 67 10/23/2023   CALCIUM  8.8 10/23/2023   PROT 6.4 10/23/2023   ALBUMIN 4.2 10/23/2023   LABGLOB 2.2 10/23/2023   BILITOT 0.2 10/23/2023   ALKPHOS 82 10/23/2023   AST 13 10/23/2023   ALT 9 10/23/2023   ANIONGAP 8 07/20/2022   Last lipids Lab Results  Component Value Date   CHOL 145 10/23/2023   HDL 46 10/23/2023   LDLCALC 63 10/23/2023   TRIG 220 (H) 10/23/2023   CHOLHDL 3.2 10/23/2023   Last hemoglobin A1c No results found for: HGBA1C Last thyroid  functions Lab Results  Component Value Date   TSH 0.237 (L) 10/23/2023   Last vitamin D  Lab Results  Component Value Date   VD25OH 30.1 10/23/2023   Last vitamin B12 and  Folate Lab Results  Component Value Date   VITAMINB12 409 10/23/2023   FOLATE 8.4 10/23/2023      The ASCVD Risk score (Arnett DK, et al., 2019) failed to calculate for the following reasons:   The 2019 ASCVD risk score is only valid for ages 54 to 72    Assessment & Plan:   Problem List Items Addressed This Visit   None   No follow-ups on file.    Leita Longs, FNP

## 2024-04-23 DIAGNOSIS — R300 Dysuria: Secondary | ICD-10-CM | POA: Insufficient documentation

## 2024-04-23 NOTE — Assessment & Plan Note (Signed)
 She is unable to void today.  Will treat with Bactrim  based on previous urine culture a few months ago.  Recommend obtaining urine culture if symptoms are not improving.

## 2024-04-23 NOTE — Assessment & Plan Note (Signed)
 Reports poor control with current dose of mirtazapine .  Will increase dose to 30 mg nightly.  Recommend adding a magnesium supplement as well to help with sleep.

## 2024-04-23 NOTE — Assessment & Plan Note (Signed)
 History of MNG.  Previously followed by endocrinology.  Remains asymptomatic.  Repeat thyroid studies ordered today.

## 2024-04-23 NOTE — Assessment & Plan Note (Signed)
 Remains on atorvastatin  20 mg daily.  Will obtain lipid panel at future visit

## 2024-05-03 ENCOUNTER — Ambulatory Visit: Payer: Self-pay | Admitting: Nurse Practitioner

## 2024-09-02 ENCOUNTER — Ambulatory Visit: Payer: Medicare HMO

## 2024-09-02 ENCOUNTER — Ambulatory Visit

## 2024-10-15 ENCOUNTER — Ambulatory Visit

## 2024-10-23 ENCOUNTER — Ambulatory Visit
